# Patient Record
Sex: Female | Born: 1940
Health system: Southern US, Community
[De-identification: ages and names within clinical notes are randomized; demographics above are authoritative.]

## PROBLEM LIST (undated history)

## (undated) DIAGNOSIS — F419 Anxiety disorder, unspecified: Secondary | ICD-10-CM

## (undated) DIAGNOSIS — I10 Essential (primary) hypertension: Secondary | ICD-10-CM

## (undated) DIAGNOSIS — F329 Major depressive disorder, single episode, unspecified: Secondary | ICD-10-CM

## (undated) DIAGNOSIS — M1990 Unspecified osteoarthritis, unspecified site: Secondary | ICD-10-CM

## (undated) DIAGNOSIS — T7840XA Allergy, unspecified, initial encounter: Secondary | ICD-10-CM

## (undated) DIAGNOSIS — F32A Depression, unspecified: Secondary | ICD-10-CM

## (undated) DIAGNOSIS — K589 Irritable bowel syndrome without diarrhea: Secondary | ICD-10-CM

## (undated) HISTORY — DX: Anxiety disorder, unspecified: F41.9

## (undated) HISTORY — PX: HERNIA REPAIR: SHX51

## (undated) HISTORY — DX: Allergy, unspecified, initial encounter: T78.40XA

## (undated) HISTORY — DX: Irritable bowel syndrome, unspecified: K58.9

## (undated) HISTORY — DX: Depression, unspecified: F32.A

## (undated) HISTORY — DX: Essential (primary) hypertension: I10

## (undated) HISTORY — DX: Unspecified osteoarthritis, unspecified site: M19.90

## (undated) HISTORY — PX: TUBAL LIGATION: SHX77

## (undated) HISTORY — PX: EYE SURGERY: SHX253

## (undated) HISTORY — DX: Major depressive disorder, single episode, unspecified: F32.9

---
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
## 1976-07-02 HISTORY — PX: OTHER SURGICAL HISTORY: SHX169

## 1998-09-07 ENCOUNTER — Other Ambulatory Visit: Admission: RE | Admit: 1998-09-07 | Discharge: 1998-09-07 | Payer: Self-pay | Admitting: Gynecology

## 1999-11-20 ENCOUNTER — Other Ambulatory Visit: Admission: RE | Admit: 1999-11-20 | Discharge: 1999-11-20 | Payer: Self-pay | Admitting: Gynecology

## 2000-12-17 ENCOUNTER — Other Ambulatory Visit: Admission: RE | Admit: 2000-12-17 | Discharge: 2000-12-17 | Payer: Self-pay | Admitting: Gynecology

## 2002-01-15 ENCOUNTER — Other Ambulatory Visit: Admission: RE | Admit: 2002-01-15 | Discharge: 2002-01-15 | Payer: Self-pay | Admitting: Gynecology

## 2003-02-08 ENCOUNTER — Other Ambulatory Visit: Admission: RE | Admit: 2003-02-08 | Discharge: 2003-02-08 | Payer: Self-pay | Admitting: Gynecology

## 2004-02-14 ENCOUNTER — Other Ambulatory Visit: Admission: RE | Admit: 2004-02-14 | Discharge: 2004-02-14 | Payer: Self-pay | Admitting: Gynecology

## 2005-02-16 ENCOUNTER — Other Ambulatory Visit: Admission: RE | Admit: 2005-02-16 | Discharge: 2005-02-16 | Payer: Self-pay | Admitting: Gynecology

## 2010-06-14 DIAGNOSIS — E0520 Thyrotoxicosis with toxic multinodular goiter without thyrotoxic crisis or storm: Secondary | ICD-10-CM | POA: Insufficient documentation

## 2010-06-14 DIAGNOSIS — M8580 Other specified disorders of bone density and structure, unspecified site: Secondary | ICD-10-CM | POA: Insufficient documentation

## 2010-06-14 DIAGNOSIS — E559 Vitamin D deficiency, unspecified: Secondary | ICD-10-CM | POA: Insufficient documentation

## 2014-02-18 DIAGNOSIS — F5101 Primary insomnia: Secondary | ICD-10-CM | POA: Insufficient documentation

## 2014-04-01 DIAGNOSIS — J8410 Pulmonary fibrosis, unspecified: Secondary | ICD-10-CM | POA: Insufficient documentation

## 2017-12-17 ENCOUNTER — Encounter

## 2017-12-17 ENCOUNTER — Ambulatory Visit (INDEPENDENT_AMBULATORY_CARE_PROVIDER_SITE_OTHER): Payer: Medicare Other | Admitting: Psychiatry

## 2017-12-17 ENCOUNTER — Encounter (HOSPITAL_COMMUNITY): Payer: Self-pay | Admitting: Psychiatry

## 2017-12-17 VITALS — BP 140/75 | HR 78 | Ht 67.75 in | Wt 161.8 lb

## 2017-12-17 DIAGNOSIS — G4709 Other insomnia: Secondary | ICD-10-CM

## 2017-12-17 DIAGNOSIS — F332 Major depressive disorder, recurrent severe without psychotic features: Secondary | ICD-10-CM

## 2017-12-17 MED ORDER — RAMELTEON 8 MG PO TABS: 8.0000 mg | ORAL_TABLET | Freq: Every day | ORAL | 0 refills | Status: DC

## 2017-12-17 NOTE — Progress Notes (Signed)
Psychiatric Initial Adult Assessment   Patient Identification: Michele Flores MRN:  [id number] Date of Evaluation:  12/17/2017 Referral Source: Dr. Justin Mend, PCP Chief Complaint:  anxiety Visit Diagnosis:    ICD-10-CM   1. Severe episode of recurrent major depressive disorder, without psychotic features ([place]) F33.2   2. Other insomnia G47.09 ramelteon (ROZEREM) 8 MG tablet    History of Present Illness:  Michele Flores is a 77 year old female with a long-standing history of severe recurrent major depressive disorder, insomnia, and multiple psychosocial and external stressors.  She has been tried on a myriad of medications including Prozac, Lexapro, Celexa, vortioxetine, Lamictal, Wellbutrin, amitriptyline.  She has failed to have an adequate response with multiple adequate trials of these medications.  She continues to present with depression symptoms that she feels are getting in the way of her enjoying her retirement and spending time with her husband.  She does have a history of trauma and significant relationship strains in her prior marriages, and recognizes that she has a pattern of controlling behaviors and tends to be anxious about things that she cannot control.  She has a son who suffers with severe persistent mental illness, and she is very involved in his care and coordination of his needs.  She lives in [place] and has a good relationship with her husband of 14 years.  She has a strained relationship with her daughter, but tries to do what she can to keep the peace.  She denies any suicidal thoughts or self-injurious behaviors and has no history of psychiatric hospitalization.  She is on clonazepam prescribed by primary care provider.  I expressed my concern about long-standing clonazepam and/or any other benzodiazepines because of the increased risk of falls, dementia, and cognitive dulling, in addition to the habit forming nature of these medications.  She was with very receptive to  trying ramelteon nightly for sleep, and I educated her on this mechanism of action acting on MT1 receptors.  With regard to mood and depressive symptoms, I suggested we consider TMS given her multiple treatment failures.  She does not have any medical contraindications to [place], no metallic fragments, implanted devices, aneurysm clips, or other metallic objects.  I reviewed the risks and benefits of TMS including the risk of seizure in rare circumstances.  Discussed the 36 treatments generally required for adequate response, and she was receptive to receiving prior authorization information with the [facility] team.  Associated Signs/Symptoms: Depression Symptoms:  depressed mood, insomnia, difficulty concentrating, anxiety, (Hypo) Manic Symptoms:  Irritable Mood, Anxiety Symptoms:  Excessive Worry, Psychotic Symptoms:  none PTSD Symptoms: Negative  Past Psychiatric History: No hospitalizations, history of outpatient psychiatric treatment with multiple psychiatric PAs  Previous Psychotropic Medications: Yes   Substance Abuse History in the last 12 months:  No.  Consequences of Substance Abuse: Negative  Past Medical History:  Past Medical History:  Diagnosis Date  . Anxiety   . Depression   . Hypertension    History reviewed. No pertinent surgical history.  Family Psychiatric History: Family history of schizophrenia, her son  Family History: History reviewed. No pertinent family history.  Social History:   Social History   Socioeconomic History  . Marital status: Married    Spouse name: Not on file  . Number of children: Not on file  . Years of education: Not on file  . Highest education level: Not on file  Occupational History  . Not on file  Social Needs  . Financial resource strain: Not on file  .  Food insecurity:    Worry: Not on file    Inability: Not on file  . Transportation needs:    Medical: Not on file    Non-medical: Not on file  Tobacco Use  . Smoking  status: Never Smoker  . Smokeless tobacco: Never Used  Substance and Sexual Activity  . Alcohol use: Yes    Alcohol/week: 0.6 oz    Types: 1 Glasses of wine per week    Comment: wine  . Drug use: Never  . Sexual activity: Not Currently  Lifestyle  . Physical activity:    Days per week: Not on file    Minutes per session: Not on file  . Stress: Not on file  Relationships  . Social connections:    Talks on phone: Not on file    Gets together: Not on file    Attends religious service: Not on file    Active member of club or organization: Not on file    Attends meetings of clubs or organizations: Not on file    Relationship status: Not on file  Other Topics Concern  . Not on file  Social History Narrative  . Not on file    Additional Social History: Lives with her husband of 15 years, has 2 children from her first husband.  Has 3 prior divorce.  She is a retired Pharmacist, hospital, worked for 35 years  Allergies:  No Known Allergies  Metabolic Disorder Labs: No results found for: HGBA1C, MPG No results found for: PROLACTIN No results found for: CHOL, TRIG, HDL, CHOLHDL, VLDL, LDLCALC   Current Medications: Current Outpatient Medications  Medication Sig Dispense Refill  . BESIVANCE 0.6 % SUSP INSTILL 1 DROP INTO RIGHT EYE 3 TIMES A DAY AS DIRECTED  1  . cholecalciferol (VITAMIN D) 1000 units tablet Take 2,000 Units by mouth daily.    . clonazePAM (KLONOPIN) 1 MG tablet Take 1 mg by mouth 2 (two) times daily as needed.  5  . DUREZOL 0.05 % EMUL APPLY 1 DROP INTO RIGHT EYE THREE TIMES A DAY AS DIRECTED  1  . lisinopril (PRINIVIL,ZESTRIL) 20 MG tablet Take 20 mg by mouth daily.  3  . PROLENSA 0.07 % SOLN APPLY 1 DROP INTO RIGHT EYE AT BEDTIME AS DIRECTED  1  . ramelteon (ROZEREM) 8 MG tablet Take 1 tablet (8 mg total) by mouth at bedtime. 90 tablet 0   No current facility-administered medications for this visit.     Neurologic: Headache: Negative Seizure:  Negative Paresthesias:Negative  Musculoskeletal: Strength & Muscle Tone: within normal limits Gait & Station: normal Patient leans: N/A  Psychiatric Specialty Exam: Review of Systems  Constitutional: Negative.   HENT: Negative.   Respiratory: Negative.   Cardiovascular: Negative.   Gastrointestinal: Negative.   Musculoskeletal: Negative.   Neurological: Negative.   Psychiatric/Behavioral: Positive for depression. The patient has insomnia.     Blood pressure 140/75, pulse 78, height 5' 7.75" (1.721 m), weight 161 lb 12.8 oz (73.4 kg).Body mass index is 24.78 kg/m.  General Appearance: Casual and Well Groomed  Eye Contact:  Good  Speech:  Clear and Coherent and Normal Rate  Volume:  Normal  Mood:  Depressed and Dysphoric  Affect:  Congruent  Thought Process:  Coherent and Descriptions of Associations: Intact  Orientation:  Full (Time, Place, and Person)  Thought Content:  Logical  Suicidal Thoughts:  No  Homicidal Thoughts:  No  Memory:  Immediate;   Good  Judgement:  Good  Insight:  Good  Psychomotor Activity:  Normal  Concentration:  Attention Span: Good  Recall:  Good  Fund of Knowledge:Good  Language: Good  Akathisia:  Negative  Handed:  Right  AIMS (if indicated):  na  Assets:  Communication Skills Desire for Improvement Financial Resources/Insurance Housing Intimacy  ADL's:  Intact  Cognition: WNL  Sleep:  poor    Treatment Plan Summary: Michele Flores is a 77 year old female with a psychiatric history consistent with severe recurrent major depressive disorder.  She is generally a resilient individual and has been able to maintain a fairly high level of function, but reports subjectively severe symptoms and feels that this has negatively affected her ability to enjoy her retirement, and she continues to struggle with ongoing rumination, pessimism, excessive worry, insomnia.  She has no acute safety issues.  She has failed a myriad of psychiatric medications,  over 20 medications and total.  She appears to be an appropriate candidate for [place] and she is agreeable to consider this treatment option.  We agreed to start ramelteon nightly for her sleep symptoms, with the goal of discontinuing the use of clonazepam given the myriad of deleterious effects, and increased risk of falls and neurocognitive disorder.  1. Severe episode of recurrent major depressive disorder, without psychotic features ([place])   2. Other insomnia     Status of current problems: New to [employer] Ordered: No orders of the defined types were placed in this encounter.   Labs Reviewed: NA  Collateral Obtained/Records Reviewed: reviewed [facility]   Plan:  Initiate ramelteon 8 mg nightly [place] prior authorization Provided the patient with literature regarding [place] so she can continue to consider if she would like to proceed with this treatment rtc 6-8 weeks  Aundra Dubin, MD 6/18/201912:02 PM

## 2017-12-17 NOTE — Patient Instructions (Signed)
[employer] [place] [email or web address]

## 2018-01-17 ENCOUNTER — Other Ambulatory Visit (HOSPITAL_COMMUNITY): Payer: Medicare Other | Attending: Psychiatry | Admitting: Emergency Medicine

## 2018-01-17 DIAGNOSIS — F332 Major depressive disorder, recurrent severe without psychotic features: Secondary | ICD-10-CM

## 2018-01-17 NOTE — Progress Notes (Signed)
[facility] for cortical mapping and motor threshold determination for Repetitive Transcranial Magnetic Stimulation treatment for Major Depressive Disorder. Pt completed a PHQ-9 with a score of 24(severe depression). Pt also completed a Beck's Depression Inventory with a score of 33 (severe depression). Prior to procedure, pt signed an informed consent agreement for TMS treatment. Pt's treatment area was found by applying single pulses to her left motor cortex, hunting along the anterior/posterior plane and along the superior oblique angle until the best motor response was elicited from the pt's right thumb. The best response was observed at 6.0 cm A/P and 35 degrees SOA, with a coil angle of 0 degrees. Pt's motor threshold was calculated using the [employer]'s proprietary MT Assist algorithm, which produced a calculated motor threshold of 1.75 SMT. Per these findings, pt's treatment parameters are as follows: A/P -- 11.5 cm, SOA -- 35 degrees, Coil Angle --  +0 degrees, Motor Threshold -- .98 SMT. With these parameters, the pt will receive 36 sessions of TMS according to the following protocol: 3000 pulses per session, with stimulation in bursts of pulses lasting 4 seconds at a frequency of 10 Hz, separated by 26 seconds of rest. After determining pt's tx parameters, coil was moved to the treatment location, and the first burst of pulses was applied at a reduced power of 80%MT. Power level was titrated to 95% today. Power level of 120% will be reached by the end of next week. Pt reported no complaints, and stated that the stimulation was tolerable. Upon completion of mapping, pt completed a few treatment intervals for observation of side effects. Pt tolerated tx well. Pt departed from clinic without issue.

## 2018-01-20 ENCOUNTER — Other Ambulatory Visit (INDEPENDENT_AMBULATORY_CARE_PROVIDER_SITE_OTHER): Payer: Medicare Other | Admitting: Emergency Medicine

## 2018-01-20 DIAGNOSIS — F332 Major depressive disorder, recurrent severe without psychotic features: Secondary | ICD-10-CM | POA: Diagnosis not present

## 2018-01-20 NOTE — Progress Notes (Signed)
[facility] for Repetitive Transcranial Magnetic Stimulation treatment for severe episode of recurrent major depressive disorder, without psychotic features. Patient presented with appropriate affect, level mood and denied any suicidal or homicidal ideations. Patient denies any other current symptoms and remains optimistic with continued TMS treatment. Patient reported no change in alcohol/substane use, caffeine consumption, sleep pattern or metal implant status since previous treatment. Ptpresented withpleasantaffect to txsession.She shared that she had a rough weekend with her mentally ill son. She expressed that her relationship with her daughter and son are her main stressors.She watched videos on her phone during tx session. Power levelwas at titrated to 120%for the duration of the tx.Patient reported nocomplaints anddiscomfort during tx.Patientdeparted post-treatment with no majorreported concerns.

## 2018-01-21 ENCOUNTER — Other Ambulatory Visit (INDEPENDENT_AMBULATORY_CARE_PROVIDER_SITE_OTHER): Payer: Medicare Other | Admitting: Emergency Medicine

## 2018-01-21 DIAGNOSIS — F332 Major depressive disorder, recurrent severe without psychotic features: Secondary | ICD-10-CM | POA: Diagnosis not present

## 2018-01-21 NOTE — Progress Notes (Signed)
[facility] for Repetitive Transcranial Magnetic Stimulation treatment for severe episode of recurrent major depressive disorder, without psychotic features. Patient presented with appropriate affect, level mood and denied any suicidal or homicidal ideations. Patient denies any other current symptoms and remains optimistic with continued TMS treatment. Patient reported no change in alcohol/substane use, caffeine consumption, sleep pattern or metal implant status since previous treatment. Ptpresented withpleasantaffect to txsession.She shared that she experienced a headache at the end of the afternoon. She took an Advil this morning before reporting to tx. She watched TV during tx session. Power level remains at120%for the duration of the tx.Patient reported nocomplaints anddiscomfort during tx.Patientdeparted post-treatment with no majorreported concerns.

## 2018-01-22 ENCOUNTER — Other Ambulatory Visit (INDEPENDENT_AMBULATORY_CARE_PROVIDER_SITE_OTHER): Payer: Medicare Other | Admitting: Emergency Medicine

## 2018-01-22 DIAGNOSIS — F332 Major depressive disorder, recurrent severe without psychotic features: Secondary | ICD-10-CM | POA: Diagnosis not present

## 2018-01-22 NOTE — Progress Notes (Signed)
Patient reported to [facility] for Repetitive Transcranial Magnetic Stimulation treatment for severe episode of recurrent major depressive disorder, without psychotic features. Patient presented with appropriate affect, level mood and denied any suicidal or homicidal ideations. Patient denies any other current symptoms and remains optimistic with continued TMS treatment. Patient reported no change in alcohol/substane use, caffeine consumption, sleep pattern or metal implant status since previous treatment. Ptpresented withpleasantaffect to txsession. She watched TV during tx session.Powerlevel remains at120%for the duration of the tx.Patient reported nocomplaints anddiscomfort during tx.Patientdeparted post-treatment with no majorreported concerns.  

## 2018-01-23 ENCOUNTER — Other Ambulatory Visit (INDEPENDENT_AMBULATORY_CARE_PROVIDER_SITE_OTHER): Payer: Medicare Other | Admitting: Emergency Medicine

## 2018-01-23 DIAGNOSIS — F332 Major depressive disorder, recurrent severe without psychotic features: Secondary | ICD-10-CM | POA: Diagnosis not present

## 2018-01-23 NOTE — Progress Notes (Signed)
[facility] for Repetitive Transcranial Magnetic Stimulation treatment for severe episode of recurrent major depressive disorder, without psychotic features. Patient presented with appropriate affect, level mood and denied any suicidal or homicidal ideations. Patient denies any other current symptoms and remains optimistic with continued TMS treatment. Patient reported no change in alcohol/substane use, caffeine consumption, sleep pattern or metal implant status since previous treatment. Ptpresented withpleasantaffect to txsession. However, she feels anxious this morning - there is no cause. She watched TV during tx session. Power level remains at120%for the duration of the tx.Patient reported nocomplaints anddiscomfort during tx.Patientdeparted post-treatment with no majorreported concerns.

## 2018-01-24 ENCOUNTER — Other Ambulatory Visit (INDEPENDENT_AMBULATORY_CARE_PROVIDER_SITE_OTHER): Payer: Medicare Other | Admitting: Emergency Medicine

## 2018-01-24 DIAGNOSIS — F332 Major depressive disorder, recurrent severe without psychotic features: Secondary | ICD-10-CM | POA: Diagnosis not present

## 2018-01-24 NOTE — Progress Notes (Signed)
[facility] for Repetitive Transcranial Magnetic Stimulation treatment for severe episode of recurrent major depressive disorder, without psychotic features. Patient presented with appropriate affect, level mood and denied any suicidal or homicidal ideations. Patient denies any other current symptoms and remains optimistic with continued TMS treatment. Patient reported no change in alcohol/substane use, caffeine consumption, sleep pattern or metal implant status since previous treatment. Ptpresented withpleasantaffect to txsession. She watched TV during tx session.Powerlevel remains at120%for the duration of the tx.Patient reported nocomplaints anddiscomfort during tx.Patientdeparted post-treatment with no majorreported concerns.

## 2018-01-27 ENCOUNTER — Other Ambulatory Visit (INDEPENDENT_AMBULATORY_CARE_PROVIDER_SITE_OTHER): Payer: Medicare Other | Admitting: Emergency Medicine

## 2018-01-27 DIAGNOSIS — F332 Major depressive disorder, recurrent severe without psychotic features: Secondary | ICD-10-CM

## 2018-01-27 NOTE — Progress Notes (Signed)
[facility] for Repetitive Transcranial Magnetic Stimulation treatment for severe episode of recurrent major depressive disorder, without psychotic features. Patient presented with appropriate affect, level mood and denied any suicidal or homicidal ideations. Patient denies any other current symptoms and remains optimistic with continued TMS treatment. Patient reported no change in alcohol/substane use, caffeine consumption, sleep pattern or metal implant status since previous treatment. Ptpresented withpleasantaffect to txsession. She reported that she had an eventful weekend with her son. Her son has been the main stressor in her life. His mental illness has been affecting her.She watched TV during tx session.Powerlevel remains at120%for the duration of the tx.Patient reported nocomplaints anddiscomfort during tx.Patientdeparted post-treatment with no majorreported concerns.

## 2018-01-28 ENCOUNTER — Other Ambulatory Visit (INDEPENDENT_AMBULATORY_CARE_PROVIDER_SITE_OTHER): Payer: Medicare Other | Admitting: Emergency Medicine

## 2018-01-28 DIAGNOSIS — F332 Major depressive disorder, recurrent severe without psychotic features: Secondary | ICD-10-CM | POA: Diagnosis not present

## 2018-01-28 NOTE — Progress Notes (Signed)
Patient reported to [facility] for Repetitive Transcranial Magnetic Stimulation treatment for severe episode of recurrent major depressive disorder, without psychotic features. Patient presented with appropriate affect, level mood and denied any suicidal or homicidal ideations. Patient denies any other current symptoms and remains optimistic with continued TMS treatment. Patient reported no change in alcohol/substane use, caffeine consumption, sleep pattern or metal implant status since previous treatment. Ptpresented withpleasantaffect to txsession. She watched TV during tx session.Powerlevel remains at120%for the duration of the tx.Patient reported nocomplaints anddiscomfort during tx.Patientdeparted post-treatment with no majorreported concerns.  

## 2018-01-29 ENCOUNTER — Other Ambulatory Visit (INDEPENDENT_AMBULATORY_CARE_PROVIDER_SITE_OTHER): Payer: Medicare Other | Admitting: Emergency Medicine

## 2018-01-29 DIAGNOSIS — F332 Major depressive disorder, recurrent severe without psychotic features: Secondary | ICD-10-CM

## 2018-01-29 NOTE — Progress Notes (Signed)
[facility] for Repetitive Transcranial Magnetic Stimulation treatment for severe episode of recurrent major depressive disorder, without psychotic features. Patient presented with appropriate affect, level mood and denied any suicidal or homicidal ideations. Patient denies any other current symptoms and remains optimistic with continued TMS treatment. Patient reported no change in alcohol/substane use, caffeine consumption, sleep pattern or metal implant status since previous treatment. Ptpresented withpleasantaffect to txsession with her husband, Michele MaduroRobert. She watched TV during tx session and periodically talked to Clinical research associatewriter and husband..Powerlevel remains at120%for the duration of the tx.Patient reported nocomplaints anddiscomfort during tx.Patientand husband departed post-treatment with no majorreported concerns.

## 2018-01-30 ENCOUNTER — Other Ambulatory Visit (HOSPITAL_COMMUNITY): Payer: Medicare Other | Attending: Psychiatry | Admitting: Emergency Medicine

## 2018-01-30 ENCOUNTER — Ambulatory Visit (HOSPITAL_COMMUNITY): Payer: Self-pay | Admitting: Psychiatry

## 2018-01-30 DIAGNOSIS — F332 Major depressive disorder, recurrent severe without psychotic features: Secondary | ICD-10-CM | POA: Diagnosis not present

## 2018-01-30 NOTE — Progress Notes (Signed)
Patient reported to [facility] for Repetitive Transcranial Magnetic Stimulation treatment for severe episode of recurrent major depressive disorder, without psychotic features. Patient presented with appropriate affect, level mood and denied any suicidal or homicidal ideations. Patient denies any other current symptoms and remains optimistic with continued TMS treatment. Patient reported no change in alcohol/substane use, caffeine consumption, sleep pattern or metal implant status since previous treatment. Ptpresented withpleasantaffect to txsession. She watched TV during tx session.Powerlevel remains at120%for the duration of the tx.Patient reported nocomplaints anddiscomfort during tx.Patientdeparted post-treatment with no majorreported concerns.  

## 2018-01-31 ENCOUNTER — Encounter (HOSPITAL_COMMUNITY): Payer: Self-pay | Admitting: Psychiatry

## 2018-01-31 ENCOUNTER — Encounter (HOSPITAL_COMMUNITY): Payer: Medicare Other

## 2018-01-31 ENCOUNTER — Ambulatory Visit (HOSPITAL_COMMUNITY): Payer: Medicare Other | Admitting: Psychiatry

## 2018-01-31 VITALS — BP 136/81 | HR 98 | Ht 67.75 in | Wt 162.0 lb

## 2018-01-31 DIAGNOSIS — F332 Major depressive disorder, recurrent severe without psychotic features: Secondary | ICD-10-CM

## 2018-01-31 NOTE — Progress Notes (Signed)
BH MD/PA/NP OP Progress Note  01/31/2018 8:55 AM Michele BillsBonnie Flores  MRN:  [id number]  Chief Complaint: tms follow-up  HPI: Michele BillsBonnie Flores has yet to feel any symptom improvement yet with TMS. Currently in week 2. Discussed tolerabiltiy which has improved. Agreed to continue course and follow up in 8 weeks. No acute si, hi, avh. Sleep remains an intermittent issue but otherwise no acute needs/complaints at this time.   Visit Diagnosis:    ICD-10-CM   1. Major depressive disorder, recurrent, severe without psychotic features ([facility]) F33.2     Past Psychiatric History: See intake H&P for full details. Reviewed, with no updates at this time.   Past Medical History:  Past Medical History:  Diagnosis Date  . Anxiety   . Depression   . Hypertension    No past surgical history on file.  Family Psychiatric History: See intake H&P for full details. Reviewed, with no updates at this time.   Family History: No family history on file.  Social History:  Social History   Socioeconomic History  . Marital status: Married    Spouse name: Not on file  . Number of children: Not on file  . Years of education: Not on file  . Highest education level: Not on file  Occupational History  . Not on file  Social Needs  . Financial resource strain: Not on file  . Food insecurity:    Worry: Not on file    Inability: Not on file  . Transportation needs:    Medical: Not on file    Non-medical: Not on file  Tobacco Use  . Smoking status: Never Smoker  . Smokeless tobacco: Never Used  Substance and Sexual Activity  . Alcohol use: Yes    Alcohol/week: 0.6 oz    Types: 1 Glasses of wine per week    Comment: wine  . Drug use: Never  . Sexual activity: Not Currently  Lifestyle  . Physical activity:    Days per week: Not on file    Minutes per session: Not on file  . Stress: Not on file  Relationships  . Social connections:    Talks on phone: Not on file    Gets together: Not on file    Attends  religious service: Not on file    Active member of club or organization: Not on file    Attends meetings of clubs or organizations: Not on file    Relationship status: Not on file  Other Topics Concern  . Not on file  Social History Narrative  . Not on file    Allergies: No Known Allergies  Metabolic Disorder Labs: No results found for: HGBA1C, MPG No results found for: PROLACTIN No results found for: CHOL, TRIG, HDL, CHOLHDL, VLDL, LDLCALC No results found for: TSH  Therapeutic Level Labs: No results found for: LITHIUM No results found for: VALPROATE No components found for:  CBMZ  Current Medications: Current Outpatient Medications  Medication Sig Dispense Refill  . clonazePAM (KLONOPIN) 1 MG tablet Take 1 mg by mouth 2 (two) times daily as needed.  5  . lisinopril (PRINIVIL,ZESTRIL) 20 MG tablet Take 20 mg by mouth daily.  3  . BESIVANCE 0.6 % SUSP INSTILL 1 DROP INTO RIGHT EYE 3 TIMES A DAY AS DIRECTED  1  . cholecalciferol (VITAMIN D) 1000 units tablet Take 2,000 Units by mouth daily.    . DUREZOL 0.05 % EMUL APPLY 1 DROP INTO RIGHT EYE THREE TIMES A DAY AS DIRECTED  1  . PROLENSA 0.07 % SOLN APPLY 1 DROP INTO RIGHT EYE AT BEDTIME AS DIRECTED  1  . ramelteon (ROZEREM) 8 MG tablet Take 1 tablet (8 mg total) by mouth at bedtime. (Patient not taking: Reported on 01/31/2018) 90 tablet 0   No current facility-administered medications for this visit.      Musculoskeletal: Strength & Muscle Tone: within normal limits Gait & Station: normal Patient leans: N/A  Psychiatric Specialty Exam: ROS  Blood pressure 136/81, pulse 98, height 5' 7.75" (1.721 m), weight 162 lb (73.5 kg), SpO2 98 %.Body mass index is 24.81 kg/m.  General Appearance: Casual and Well Groomed  Eye Contact:  Good  Speech:  Clear and Coherent and Normal Rate  Volume:  Normal  Mood:  Dysphoric  Affect:  Congruent  Thought Process:  Goal Directed and Descriptions of Associations: Intact  Orientation:   Full (Time, Place, and Person)  Thought Content: Logical   Suicidal Thoughts:  No  Homicidal Thoughts:  No  Memory:  Immediate;   Fair  Judgement:  Fair  Insight:  Good  Psychomotor Activity:  Normal  Concentration:  Concentration: Good  Recall:  Good  Fund of Knowledge: Good  Language: Good  Akathisia:  Negative  Handed:  Right  AIMS (if indicated): not done  Assets:  Communication Skills Desire for Improvement Financial Resources/Insurance Housing  ADL's:  Intact  Cognition: WNL  Sleep:  Good   Screenings:   Assessment and Plan: Michele Flores presents for TMS assessment. Current in midsst of treatment. Will continue course, given good tolerability. No acute safety concerns. Will follow-up in 8 weeks.  1. Major depressive disorder, recurrent, severe without psychotic features (HCC)     Status of current problems: unchanged  Labs Ordered: No orders of the defined types were placed in this encounter.   Labs Reviewed: n/a  Collateral Obtained/Records Reviewed: n/a  Plan:  continue TMS treatment course rtc 8 weeks  Burnard Leigh, MD 01/31/2018, 8:55 AM

## 2018-02-03 ENCOUNTER — Other Ambulatory Visit (HOSPITAL_COMMUNITY): Payer: Medicare Other | Admitting: Emergency Medicine

## 2018-02-03 DIAGNOSIS — F332 Major depressive disorder, recurrent severe without psychotic features: Secondary | ICD-10-CM

## 2018-02-03 NOTE — Progress Notes (Signed)
Patient reported to [facility] for Repetitive Transcranial Magnetic Stimulation treatment for severe episode of recurrent major depressive disorder, without psychotic features. Patient presented with appropriate affect, level mood and denied any suicidal or homicidal ideations. Patient denies any other current symptoms and remains optimistic with continued TMS treatment. Patient reported no change in alcohol/substane use, caffeine consumption, sleep pattern or metal implant status since previous treatment. Ptpresented withpleasantaffect to txsession. She watched TV during tx session.Powerlevel remains at120%for the duration of the tx.Patient reported nocomplaints anddiscomfort during tx.Patientdeparted post-treatment with no majorreported concerns.  

## 2018-02-04 ENCOUNTER — Other Ambulatory Visit (INDEPENDENT_AMBULATORY_CARE_PROVIDER_SITE_OTHER): Payer: Medicare Other | Admitting: Emergency Medicine

## 2018-02-04 DIAGNOSIS — F332 Major depressive disorder, recurrent severe without psychotic features: Secondary | ICD-10-CM

## 2018-02-04 NOTE — Progress Notes (Signed)
[facility] for Repetitive Transcranial Magnetic Stimulation treatment for severe episode of recurrent major depressive disorder, without psychotic features. Patient presented with appropriate affect, level mood and denied any suicidal or homicidal ideations. Patient denies any other current symptoms and remains optimistic with continued TMS treatment. Patient reported no change in alcohol/substane use, caffeine consumption, sleep pattern or metal implant status since previous treatment. Ptpresented withpleasantaffect to txsession.She watched TV during tx session.Powerlevel remains at120%for the duration of the tx.Patient reported nocomplaints anddiscomfort during tx.Patientdeparted post-treatment with no majorreported concerns.

## 2018-02-05 ENCOUNTER — Other Ambulatory Visit (INDEPENDENT_AMBULATORY_CARE_PROVIDER_SITE_OTHER): Payer: Medicare Other | Admitting: Emergency Medicine

## 2018-02-05 DIAGNOSIS — F332 Major depressive disorder, recurrent severe without psychotic features: Secondary | ICD-10-CM | POA: Diagnosis not present

## 2018-02-05 NOTE — Progress Notes (Signed)
Patient reported to [facility] for Repetitive Transcranial Magnetic Stimulation treatment for severe episode of recurrent major depressive disorder, without psychotic features. Patient presented with appropriate affect, level mood and denied any suicidal or homicidal ideations. Patient denies any other current symptoms and remains optimistic with continued TMS treatment. Patient reported no change in alcohol/substane use, caffeine consumption, sleep pattern or metal implant status since previous treatment. Ptpresented withpleasantaffect to txsession. She watched TV during tx session.Powerlevel remains at120%for the duration of the tx.Patient reported nocomplaints anddiscomfort during tx.Patientdeparted post-treatment with no majorreported concerns.  

## 2018-02-06 ENCOUNTER — Other Ambulatory Visit (INDEPENDENT_AMBULATORY_CARE_PROVIDER_SITE_OTHER): Payer: Medicare Other | Admitting: Emergency Medicine

## 2018-02-06 DIAGNOSIS — F332 Major depressive disorder, recurrent severe without psychotic features: Secondary | ICD-10-CM | POA: Diagnosis not present

## 2018-02-06 NOTE — Progress Notes (Signed)
[facility] for Repetitive Transcranial Magnetic Stimulation treatment for severe episode of recurrent major depressive disorder, without psychotic features. Patient presented with appropriate affect, level mood and denied any suicidal or homicidal ideations. Patient denies any other current symptoms and remains optimistic with continued TMS treatment. Patient reported no change in alcohol/substane use, caffeine consumption, sleep pattern or metal implant status since previous treatment. Ptreported to tx session with complaints of her lower back. She is unaware of the cause of the pain. She took Advil this morning and is wearing a patch on her lower back to alleviate the pain/discomfort. She watched TV during tx session.Powerlevel remains at120%for the duration of the tx.Patient reported nocomplaints anddiscomfort during tx.Patientdeparted post-treatment with no majorreported concerns.

## 2018-02-07 ENCOUNTER — Other Ambulatory Visit (INDEPENDENT_AMBULATORY_CARE_PROVIDER_SITE_OTHER): Payer: Medicare Other | Admitting: Emergency Medicine

## 2018-02-07 DIAGNOSIS — F332 Major depressive disorder, recurrent severe without psychotic features: Secondary | ICD-10-CM | POA: Diagnosis not present

## 2018-02-07 NOTE — Progress Notes (Signed)
[facility] for Repetitive Transcranial Magnetic Stimulation treatment for severe episode of recurrent major depressive disorder, without psychotic features. Patient presented with appropriate affect, level mood and denied any suicidal or homicidal ideations. Patient denies any other current symptoms and remains optimistic with continued TMS treatment. Patient reported no change in alcohol/substane use, caffeine consumption, sleep pattern or metal implant status since previous treatment. Ptreported to tx session with complaints of still having pain in her lower back. She hopes for the discomfort to alleviate in the next few days. She watched TV during tx session.Powerlevel remains at120%for the duration of the tx.Patient reported nocomplaints anddiscomfort during tx.Patientdeparted post-treatment with no majorreported concerns.

## 2018-02-10 ENCOUNTER — Other Ambulatory Visit (INDEPENDENT_AMBULATORY_CARE_PROVIDER_SITE_OTHER): Payer: Medicare Other | Admitting: Emergency Medicine

## 2018-02-10 DIAGNOSIS — F332 Major depressive disorder, recurrent severe without psychotic features: Secondary | ICD-10-CM

## 2018-02-10 NOTE — Progress Notes (Signed)
[facility] Repetitive Transcranial Magnetic Stimulation treatment for severe episode of recurrent major depressive disorder, without psychotic features. Patient presented with appropriate affect, level mood and denied any suicidal or homicidal ideations. Patient denies any other current symptoms and remains optimistic with continued TMS treatment. Patient reported no change in alcohol/substane use, caffeine consumption, sleep pattern or metal implant status since previous treatment. Ptreported to tx session with pleasant affect. She reported that her lower back has been feeling better. She watched TV during tx session. And periodically spoke with Clinical research associatewriter.Powerlevel remains at120%for the duration of the tx.Patient reported nocomplaints anddiscomfort during tx.Patientdeparted post-treatment with no majorreported concerns.

## 2018-02-11 ENCOUNTER — Other Ambulatory Visit (INDEPENDENT_AMBULATORY_CARE_PROVIDER_SITE_OTHER): Payer: Medicare Other | Admitting: Emergency Medicine

## 2018-02-11 ENCOUNTER — Encounter (HOSPITAL_COMMUNITY): Payer: Medicare Other

## 2018-02-11 DIAGNOSIS — F332 Major depressive disorder, recurrent severe without psychotic features: Secondary | ICD-10-CM | POA: Diagnosis not present

## 2018-02-11 NOTE — Progress Notes (Signed)
Patient reported to [facility] for Repetitive Transcranial Magnetic Stimulation treatment for severe episode of recurrent major depressive disorder, without psychotic features. Patient presented with appropriate affect, level mood and denied any suicidal or homicidal ideations. Patient denies any other current symptoms and remains optimistic with continued TMS treatment. Patient reported no change in alcohol/substane use, caffeine consumption, sleep pattern or metal implant status since previous treatment. Ptreported to tx session with pleasant affect. She reported that her lower back has been feeling better. She watched TV during tx session. And periodically spoke with writer.Powerlevel remains at120%for the duration of the tx.Patient reported nocomplaints anddiscomfort during tx.Patientdeparted post-treatment with no majorreported concerns. 

## 2018-02-12 ENCOUNTER — Other Ambulatory Visit (INDEPENDENT_AMBULATORY_CARE_PROVIDER_SITE_OTHER): Payer: Medicare Other | Admitting: Emergency Medicine

## 2018-02-12 DIAGNOSIS — F332 Major depressive disorder, recurrent severe without psychotic features: Secondary | ICD-10-CM

## 2018-02-12 NOTE — Progress Notes (Signed)
[facility] for Repetitive Transcranial Magnetic Stimulation treatment for severe episode of recurrent major depressive disorder, without psychotic features. Patient presented with appropriate affect, level mood and denied any suicidal or homicidal ideations. Patient denies any other current symptoms and remains optimistic with continued TMS treatment. Patient reported no change in alcohol/substane use, caffeine consumption, sleep pattern or metal implant status since previous treatment. Ptreported to tx session with pleasant affect. She shared that she woke up early this morning to do Yoga - it has been helping with her depression. She watched TV during tx session. Powerlevel remains at120%for the duration of the tx.Patient reported nocomplaints anddiscomfort during tx.Patientdeparted post-treatment with no majorreported concerns.

## 2018-02-13 ENCOUNTER — Other Ambulatory Visit (INDEPENDENT_AMBULATORY_CARE_PROVIDER_SITE_OTHER): Payer: Medicare Other | Admitting: Emergency Medicine

## 2018-02-13 DIAGNOSIS — F332 Major depressive disorder, recurrent severe without psychotic features: Secondary | ICD-10-CM

## 2018-02-13 NOTE — Progress Notes (Signed)
[facility] for Repetitive Transcranial Magnetic Stimulation treatment for severe episode of recurrent major depressive disorder, without psychotic features. Patient presented with appropriate affect, level mood and denied any suicidal or homicidal ideations. Patient denies any other current symptoms and remains optimistic with continued TMS treatment. Patient reported no change in alcohol/substane use, caffeine consumption, sleep pattern or metal implant status since previous treatment. Ptreported to tx session withpleasant affect. She shared that she feels discouraged that she has not experienced any improvements to her quality of life. She continues to workout in the morning and do yoga. She watched TV during tx session. And periodically spoke with Clinical research associatewriter. Powerlevel remains at120%for the duration of the tx.Patient reported nocomplaints anddiscomfort during tx.Patientdeparted post-treatment with no majorreported concerns.

## 2018-02-14 ENCOUNTER — Other Ambulatory Visit (INDEPENDENT_AMBULATORY_CARE_PROVIDER_SITE_OTHER): Payer: Medicare Other | Admitting: Emergency Medicine

## 2018-02-14 DIAGNOSIS — F332 Major depressive disorder, recurrent severe without psychotic features: Secondary | ICD-10-CM | POA: Diagnosis not present

## 2018-02-14 NOTE — Progress Notes (Signed)
[facility] for Repetitive Transcranial Magnetic Stimulation treatment for severe episode of recurrent major depressive disorder, without psychotic features. Patient presented with appropriate affect, level mood and denied any suicidal or homicidal ideations. Patient denies any other current symptoms and remains optimistic with continued TMS treatment. Patient reported no change in alcohol/substane use, caffeine consumption, sleep pattern or metal implant status since previous treatment. Ptreported to tx session withpleasant affect. She shared that she celebrated her son's birthday at [employer]. She did not have a good experience.  She reported her improvement that she has been noticing from TMS is fleeting. However, she remains hopeful. She watched TV periodically spoke with Clinical research associatewriter. Powerlevel remains at120%for the duration of the tx.Patient reported nocomplaints anddiscomfort during tx.Patientdeparted post-treatment with no majorreported concerns.

## 2018-02-17 ENCOUNTER — Other Ambulatory Visit (INDEPENDENT_AMBULATORY_CARE_PROVIDER_SITE_OTHER): Payer: Medicare Other | Admitting: Emergency Medicine

## 2018-02-17 DIAGNOSIS — F332 Major depressive disorder, recurrent severe without psychotic features: Secondary | ICD-10-CM

## 2018-02-17 NOTE — Progress Notes (Signed)
[facility] for Repetitive Transcranial Magnetic Stimulation treatment for severe episode of recurrent major depressive disorder, without psychotic features. Patient presented with appropriate affect, level mood and denied any suicidal or homicidal ideations. Patient denies any other current symptoms and remains optimistic with continued TMS treatment. Patient reported no change in alcohol/substane use, caffeine consumption, sleep pattern or metal implant status since previous treatment. Ptreported to tx session withpleasant affect. She watched TV periodically spoke with Clinical research associatewriter. She shared that she has plans to spend time with her son this afternoon.Powerlevel remains at120%for the duration of the tx.Patient reported nocomplaints anddiscomfort during tx.Patientdeparted post-treatment with no majorreported concerns.

## 2018-02-18 ENCOUNTER — Other Ambulatory Visit (INDEPENDENT_AMBULATORY_CARE_PROVIDER_SITE_OTHER): Payer: Medicare Other | Admitting: Emergency Medicine

## 2018-02-18 DIAGNOSIS — F332 Major depressive disorder, recurrent severe without psychotic features: Secondary | ICD-10-CM | POA: Diagnosis not present

## 2018-02-18 NOTE — Progress Notes (Signed)
[facility] for Repetitive Transcranial Magnetic Stimulation treatment for severe episode of recurrent major depressive disorder, without psychotic features. Patient presented with appropriate affect, level mood and denied any suicidal or homicidal ideations. Patient denies any other current symptoms and remains optimistic with continued TMS treatment. Patient reported no change in alcohol/substane use, caffeine consumption, sleep pattern or metal implant status since previous treatment. Ptreported to tx session withpleasant affect. She watched TVperiodically spoke with Clinical research associatewriter.Powerlevel remains at120%for the duration of the tx.Patient reported nocomplaints anddiscomfort during tx.Patientdeparted post-treatment with no majorreported concerns.

## 2018-02-19 ENCOUNTER — Other Ambulatory Visit (INDEPENDENT_AMBULATORY_CARE_PROVIDER_SITE_OTHER): Payer: Medicare Other | Admitting: Emergency Medicine

## 2018-02-19 DIAGNOSIS — F332 Major depressive disorder, recurrent severe without psychotic features: Secondary | ICD-10-CM

## 2018-02-19 NOTE — Progress Notes (Signed)
Patient reported to  [facility] for Repetitive Transcranial Magnetic Stimulation treatment for severe episode of recurrent major depressive disorder, without psychotic features. Patient presented with appropriate affect, level mood and denied any suicidal or homicidal ideations. Patient denies any other current symptoms and remains optimistic with continued TMS treatment. Patient reported no change in alcohol/substane use, caffeine consumption, sleep pattern or metal implant status since previous treatment. Ptreported to tx session withpleasant affect. She watched TVperiodically spoke with writer.Powerlevel remains at120%for the duration of the tx.Patient reported nocomplaints anddiscomfort during tx.Patientdeparted post-treatment with no majorreported concerns. 

## 2018-02-20 ENCOUNTER — Other Ambulatory Visit (INDEPENDENT_AMBULATORY_CARE_PROVIDER_SITE_OTHER): Payer: Medicare Other | Admitting: Emergency Medicine

## 2018-02-20 DIAGNOSIS — F332 Major depressive disorder, recurrent severe without psychotic features: Secondary | ICD-10-CM | POA: Diagnosis not present

## 2018-02-20 NOTE — Progress Notes (Signed)
Patient reported to [facility] for Repetitive Transcranial Magnetic Stimulation treatment for severe episode of recurrent major depressive disorder, without psychotic features. Patient presented with appropriate affect, level mood and denied any suicidal or homicidal ideations. Patient denies any other current symptoms and remains optimistic with continued TMS treatment. Patient reported no change in alcohol/substane use, caffeine consumption, sleep pattern or metal implant status since previous treatment. Ptreported to tx session withpleasant affect. She watched TVperiodically spoke with writer.Powerlevel remains at120%for the duration of the tx.Patient reported nocomplaints anddiscomfort during tx.Patientdeparted post-treatment with no majorreported concerns. 

## 2018-02-21 ENCOUNTER — Other Ambulatory Visit (INDEPENDENT_AMBULATORY_CARE_PROVIDER_SITE_OTHER): Payer: Medicare Other | Admitting: Emergency Medicine

## 2018-02-21 DIAGNOSIS — F332 Major depressive disorder, recurrent severe without psychotic features: Secondary | ICD-10-CM

## 2018-02-21 NOTE — Progress Notes (Signed)
Patient reported to [facility] for Repetitive Transcranial Magnetic Stimulation treatment for severe episode of recurrent major depressive disorder, without psychotic features. Patient presented with appropriate affect, level mood and denied any suicidal or homicidal ideations. Patient denies any other current symptoms and remains optimistic with continued TMS treatment. Patient reported no change in alcohol/substane use, caffeine consumption, sleep pattern or metal implant status since previous treatment. Ptreported to tx session withpleasant affect. She watched TVperiodically spoke with writer.Powerlevel remains at120%for the duration of the tx.Patient reported nocomplaints anddiscomfort during tx.Patientdeparted post-treatment with no majorreported concerns. 

## 2018-02-24 ENCOUNTER — Other Ambulatory Visit (INDEPENDENT_AMBULATORY_CARE_PROVIDER_SITE_OTHER): Payer: Medicare Other | Admitting: Emergency Medicine

## 2018-02-24 DIAGNOSIS — F332 Major depressive disorder, recurrent severe without psychotic features: Secondary | ICD-10-CM

## 2018-02-24 NOTE — Progress Notes (Signed)
[facility] for Repetitive Transcranial Magnetic Stimulation treatment for severe episode of recurrent major depressive disorder, without psychotic features. Patient presented with appropriate affect, level mood and denied any suicidal or homicidal ideations. Patient denies any other current symptoms and remains optimistic with continued TMS treatment. Patient reported no change in alcohol/substane use, caffeine consumption, sleep pattern or metal implant status since previous treatment. Ptreported to tx session withpleasant affect. She had a good weekend and spent majority of her time taking care of her son. She watched TVperiodically spoke with Clinical research associatewriter. Powerlevel remains at120%for the duration of the tx.Patient reported nocomplaints anddiscomfort during tx.Patientdeparted post-treatment with no majorreported concerns.

## 2018-02-25 ENCOUNTER — Other Ambulatory Visit (INDEPENDENT_AMBULATORY_CARE_PROVIDER_SITE_OTHER): Payer: Medicare Other | Admitting: Emergency Medicine

## 2018-02-25 DIAGNOSIS — F332 Major depressive disorder, recurrent severe without psychotic features: Secondary | ICD-10-CM

## 2018-02-25 NOTE — Progress Notes (Signed)
Patient reported to [facility] for Repetitive Transcranial Magnetic Stimulation treatment for severe episode of recurrent major depressive disorder, without psychotic features. Patient presented with appropriate affect, level mood and denied any suicidal or homicidal ideations. Patient denies any other current symptoms and remains optimistic with continued TMS treatment. Patient reported no change in alcohol/substane use, caffeine consumption, sleep pattern or metal implant status since previous treatment. Ptreported to tx session withpleasant affect. She watched TVperiodically spoke with writer.Powerlevel remains at120%for the duration of the tx.Patient reported nocomplaints anddiscomfort during tx.Patientdeparted post-treatment with no majorreported concerns. 

## 2018-02-26 ENCOUNTER — Other Ambulatory Visit (INDEPENDENT_AMBULATORY_CARE_PROVIDER_SITE_OTHER): Payer: Medicare Other | Admitting: Emergency Medicine

## 2018-02-26 DIAGNOSIS — F332 Major depressive disorder, recurrent severe without psychotic features: Secondary | ICD-10-CM | POA: Diagnosis not present

## 2018-02-26 NOTE — Progress Notes (Signed)
Patient reported to [facility] for Repetitive Transcranial Magnetic Stimulation treatment for severe episode of recurrent major depressive disorder, without psychotic features. Patient presented with appropriate affect, level mood and denied any suicidal or homicidal ideations. Patient denies any other current symptoms and remains optimistic with continued TMS treatment. Patient reported no change in alcohol/substane use, caffeine consumption, sleep pattern or metal implant status since previous treatment. Ptreported to tx session withpleasant affect. She watched TVperiodically spoke with writer.Powerlevel remains at120%for the duration of the tx.Patient reported nocomplaints anddiscomfort during tx.Patientdeparted post-treatment with no majorreported concerns. 

## 2018-02-27 ENCOUNTER — Other Ambulatory Visit (INDEPENDENT_AMBULATORY_CARE_PROVIDER_SITE_OTHER): Payer: Medicare Other | Admitting: Emergency Medicine

## 2018-02-27 DIAGNOSIS — F332 Major depressive disorder, recurrent severe without psychotic features: Secondary | ICD-10-CM

## 2018-02-27 NOTE — Progress Notes (Signed)
[facility] for Repetitive Transcranial Magnetic Stimulation treatment for severe episode of recurrent major depressive disorder, without psychotic features. Patient presented with appropriate affect, level mood and denied any suicidal or homicidal ideations. Patient denies any other current symptoms and remains optimistic with continued TMS treatment. Patient reported no change in alcohol/substane use, caffeine consumption, sleep pattern or metal implant status since previous treatment. Ptreported to tx session withpleasant affect.She watched TVperiodically spoke with Clinical research associatewriter. Powerlevel remains at120%for the duration of the tx.Patient reported nocomplaints anddiscomfort during tx.Patientdeparted post-treatment with no majorreported concerns.

## 2018-02-28 ENCOUNTER — Other Ambulatory Visit (INDEPENDENT_AMBULATORY_CARE_PROVIDER_SITE_OTHER): Payer: Medicare Other | Admitting: Emergency Medicine

## 2018-02-28 ENCOUNTER — Encounter (HOSPITAL_COMMUNITY): Payer: Medicare Other | Admitting: Emergency Medicine

## 2018-02-28 DIAGNOSIS — F332 Major depressive disorder, recurrent severe without psychotic features: Secondary | ICD-10-CM

## 2018-02-28 NOTE — Progress Notes (Signed)
Patient reported to [facility] for Repetitive Transcranial Magnetic Stimulation treatment for severe episode of recurrent major depressive disorder, without psychotic features. Patient presented with appropriate affect, level mood and denied any suicidal or homicidal ideations. Patient denies any other current symptoms and remains optimistic with continued TMS treatment. Patient reported no change in alcohol/substane use, caffeine consumption, sleep pattern or metal implant status since previous treatment. Ptreported to tx session withpleasant affect. She watched TVperiodically spoke with writer.Powerlevel remains at120%for the duration of the tx.Patient reported nocomplaints anddiscomfort during tx.Patientdeparted post-treatment with no majorreported concerns. 

## 2018-03-03 ENCOUNTER — Encounter (HOSPITAL_COMMUNITY): Payer: Medicare Other

## 2018-03-04 ENCOUNTER — Other Ambulatory Visit (HOSPITAL_COMMUNITY): Payer: Medicare Other | Attending: Psychiatry

## 2018-03-04 DIAGNOSIS — F332 Major depressive disorder, recurrent severe without psychotic features: Secondary | ICD-10-CM

## 2018-03-04 NOTE — Progress Notes (Signed)
Patient reported to [facility] for Repetitive Transcranial Magnetic Stimulation treatment for severe episode of recurrent major depressive disorder, without psychotic features. Patient presented with appropriate affect, level mood and denied any suicidal or homicidal ideations. Patient denies any other current symptoms and remains optimistic with continued TMS tx. Patient reported no changed in alcohol/substance use, caffeine consumption, sleep pattern or metal implant status since previous tx. Patient arrived for tx by herself. Patient reported that she had a good weekend and relaxed at home. Patient reported no complaints or discomfort during the duration of tx. Patient watched television and engaged in conversation. Patient departed post-treatment with no reported concerns.

## 2018-03-05 ENCOUNTER — Other Ambulatory Visit (INDEPENDENT_AMBULATORY_CARE_PROVIDER_SITE_OTHER): Payer: Medicare Other

## 2018-03-05 DIAGNOSIS — F332 Major depressive disorder, recurrent severe without psychotic features: Secondary | ICD-10-CM | POA: Diagnosis not present

## 2018-03-05 NOTE — Progress Notes (Signed)
Patient reported to [facility] for Repetitive Transcranial Magnetic Stimulation treatment for severe episode of recurrent major depressive disorder, without psychotic features. Patient presented with appropriate affect, level mood and denied any suicidal or homicidal ideations. Patient denies any other current symptoms and remains optimistic with continued TMS tx. Patient reported no changed in alcohol/substance use, caffeine consumption, sleep pattern or metal implant status since previous tx. Patient reported no complaints or discomfort during the duration of tx. Patient watched television. Patient departed post-treatment with no reported concerns. 

## 2018-03-06 ENCOUNTER — Other Ambulatory Visit (INDEPENDENT_AMBULATORY_CARE_PROVIDER_SITE_OTHER): Payer: Medicare Other

## 2018-03-06 DIAGNOSIS — F332 Major depressive disorder, recurrent severe without psychotic features: Secondary | ICD-10-CM

## 2018-03-06 NOTE — Progress Notes (Signed)
Patient reported to [facility] for Repetitive Transcranial Magnetic Stimulation treatment for severe episode of recurrent major depressive disorder, without psychotic features. Patient presented with appropriate affect, level mood and denied any suicidal or homicidal ideations. Patient denies any other current symptoms and remains optimistic with continued TMS tx. Patient reported no changed in alcohol/substance use, caffeine consumption, sleep pattern or metal implant status since previous tx. Patient reported that she was having a good day today. Patient watched television during tx. Patient departed post-treatment with no reported concerns.

## 2018-03-10 ENCOUNTER — Encounter (HOSPITAL_COMMUNITY): Payer: Medicare Other | Admitting: Emergency Medicine

## 2018-03-11 ENCOUNTER — Other Ambulatory Visit (INDEPENDENT_AMBULATORY_CARE_PROVIDER_SITE_OTHER): Payer: Medicare Other | Admitting: Emergency Medicine

## 2018-03-11 DIAGNOSIS — F332 Major depressive disorder, recurrent severe without psychotic features: Secondary | ICD-10-CM | POA: Diagnosis not present

## 2018-03-11 NOTE — Progress Notes (Signed)
Patient reported to [facility] for Repetitive Transcranial Magnetic Stimulation treatment for severe episode of recurrent major depressive disorder, without psychotic features. Patient presented with appropriate affect, level mood and denied any suicidal or homicidal ideations. Patient denies any other current symptoms and remains optimistic with continued TMS treatment. Patient reported no change in alcohol/substane use, caffeine consumption, sleep pattern or metal implant status since previous treatment. Ptreported to tx session withpleasant affect.She watched TVperiodically spoke with Clinical research associate. Powerlevel remains at120%for the duration of the tx.Patient reported nocomplaints anddiscomfort during tx.Patientdeparted post-treatment with no majorreported concerns.

## 2018-03-13 ENCOUNTER — Other Ambulatory Visit (INDEPENDENT_AMBULATORY_CARE_PROVIDER_SITE_OTHER): Payer: Medicare Other | Admitting: Emergency Medicine

## 2018-03-13 DIAGNOSIS — F332 Major depressive disorder, recurrent severe without psychotic features: Secondary | ICD-10-CM | POA: Diagnosis not present

## 2018-03-13 NOTE — Progress Notes (Signed)
Patient reported to [facility] for Repetitive Transcranial Magnetic Stimulation treatment for severe episode of recurrent major depressive disorder, without psychotic features. Patient presented with appropriate affect, level mood and denied any suicidal or homicidal ideations. Patient denies any other current symptoms and remains optimistic with continued TMS treatment. Patient reported no change in alcohol/substane use, caffeine consumption, sleep pattern or metal implant status since previous treatment. Ptreported to tx session withpleasant affect.She watched TVperiodically spoke with Clinical research associatewriter. She reported that she has been frustrated dealing with her son's medical appointments and planning around his schedule. Powerlevel remains at120%for the duration of the tx.Patient reported nocomplaints anddiscomfort during tx.Patientdeparted post-treatment with no majorreported concerns.

## 2018-03-17 ENCOUNTER — Other Ambulatory Visit (INDEPENDENT_AMBULATORY_CARE_PROVIDER_SITE_OTHER): Payer: Medicare Other | Admitting: Emergency Medicine

## 2018-03-17 DIAGNOSIS — F332 Major depressive disorder, recurrent severe without psychotic features: Secondary | ICD-10-CM

## 2018-03-17 NOTE — Progress Notes (Signed)
[facility] for Repetitive Transcranial Magnetic Stimulation treatment for severe episode of recurrent major depressive disorder, without psychotic features. Patient presented with appropriate affect, level mood and denied any suicidal or homicidal ideations. Patient denies any other current symptoms and remains optimistic with continued TMS treatment. Patient reported no change in alcohol/substane use, caffeine consumption, sleep pattern or metal implant status since previous treatment. Ptreported to tx session withpleasant affect.She reported that TMS has improved her quality of life. She completed a PHQ-9 questionnaire and scored a 5. Powerlevel remains at120%for the duration of the tx.Patient reported nocomplaints anddiscomfort during tx.Patientdeparted post-treatment with no majorreported concerns.

## 2018-05-14 ENCOUNTER — Other Ambulatory Visit: Payer: Self-pay | Admitting: Gastroenterology

## 2018-05-14 DIAGNOSIS — R1012 Left upper quadrant pain: Secondary | ICD-10-CM

## 2018-05-14 DIAGNOSIS — R1011 Right upper quadrant pain: Secondary | ICD-10-CM

## 2018-05-21 ENCOUNTER — Ambulatory Visit
Admission: RE | Admit: 2018-05-21 | Discharge: 2018-05-21 | Disposition: A | Discharge status: Home / Self Care | Payer: Medicare Other | Source: Ambulatory Visit | Attending: Gastroenterology | Admitting: Gastroenterology

## 2018-05-21 DIAGNOSIS — R1011 Right upper quadrant pain: Secondary | ICD-10-CM

## 2018-05-21 DIAGNOSIS — R1012 Left upper quadrant pain: Secondary | ICD-10-CM

## 2018-05-22 ENCOUNTER — Other Ambulatory Visit: Payer: Self-pay | Admitting: Gastroenterology

## 2018-05-22 DIAGNOSIS — R935 Abnormal findings on diagnostic imaging of other abdominal regions, including retroperitoneum: Secondary | ICD-10-CM

## 2018-06-07 ENCOUNTER — Ambulatory Visit
Admission: RE | Admit: 2018-06-07 | Discharge: 2018-06-07 | Disposition: A | Discharge status: Home / Self Care | Payer: Medicare Other | Source: Ambulatory Visit | Attending: Gastroenterology | Admitting: Gastroenterology

## 2018-06-07 DIAGNOSIS — R935 Abnormal findings on diagnostic imaging of other abdominal regions, including retroperitoneum: Secondary | ICD-10-CM

## 2018-06-07 MED ORDER — GADOBENATE DIMEGLUMINE 529 MG/ML IV SOLN
15.0000 mL | Freq: Once | INTRAVENOUS | Status: AC | PRN
  Administered 2018-06-07: 15 mL via INTRAVENOUS

## 2019-01-13 ENCOUNTER — Other Ambulatory Visit: Payer: Self-pay | Admitting: Gastroenterology

## 2019-01-13 DIAGNOSIS — K8689 Other specified diseases of pancreas: Secondary | ICD-10-CM

## 2019-05-11 ENCOUNTER — Other Ambulatory Visit: Payer: Self-pay | Admitting: Gastroenterology

## 2019-05-11 DIAGNOSIS — K8689 Other specified diseases of pancreas: Secondary | ICD-10-CM

## 2019-05-19 ENCOUNTER — Ambulatory Visit (INDEPENDENT_AMBULATORY_CARE_PROVIDER_SITE_OTHER): Payer: Medicare Other | Admitting: Adult Health

## 2019-05-19 ENCOUNTER — Other Ambulatory Visit: Payer: Self-pay

## 2019-05-19 ENCOUNTER — Encounter: Payer: Self-pay | Admitting: Adult Health

## 2019-05-19 DIAGNOSIS — F605 Obsessive-compulsive personality disorder: Secondary | ICD-10-CM

## 2019-05-19 DIAGNOSIS — F331 Major depressive disorder, recurrent, moderate: Secondary | ICD-10-CM

## 2019-05-19 DIAGNOSIS — F411 Generalized anxiety disorder: Secondary | ICD-10-CM | POA: Diagnosis not present

## 2019-05-19 DIAGNOSIS — F429 Obsessive-compulsive disorder, unspecified: Secondary | ICD-10-CM

## 2019-05-19 MED ORDER — SERTRALINE HCL 25 MG PO TABS: ORAL_TABLET | ORAL | 2 refills | Status: DC

## 2019-05-19 NOTE — Progress Notes (Signed)
[facility] MD/PA/NP Initial Note  05/19/2019 11:13 AM Michele Flores  MRN:  [id number]  Chief Complaint:   HPI:  Seen by Dr. Rene Kocher previously - May 2020. Treated with TMS 2019. Diagnosed with OCPD and could not help her any more. Recommended therapy for her.   Referred by Sharrie Rothman - [facility] - has had 4 visits. She told her that her problems were so bad, she could no longer help her.   Describes mood today as "not good at all". Pleasant. Communicative. Mood symptoms - reports depression, anxiety, and irritability. Stating "I'm more anxious overall". Increased worry and rumination. Obsessive tendencies _ stating "I like for things to be "the way they should be". Has tried and failed multiple medications. Went over genetic testing.  Difficulties letting things go. Stating "I can't live like this anymore". Dealing with multiple situational stressors. Son is Schizoaffective and is on dialysis. He is non-compliant with treatment and is in poor health. Stating "he will end up on the streets if somebody doesn't help me find a place for him to go". Husband health declining. She and husband were high school "sweethearts" and it took them many years to com back together. Brother had lived on street for 10 years before she knew about it and could get him some help. PCP has her on Xanax 2mg  BID. Stating "I'm not taking that much a day". Took a 1mg  today prior to visit. Tried Zoloft 50mg  once and it made her sick and she stopped it. Stable interest and motivation. Taking medications as prescribed.  Energy levels "not to good". Active, does not have a regular exercise routine. Stating "I used to walk". Retired x 16 years - .   Enjoys some usual interests and activities. Married x 4. Lives with husband. Spending time with family. Daughter lives in and brother in .  Appetite adequate - "eating only because she has to eat". Weight loss 10 to 14. Sleeps better some  nights than others. Wakes up at 2 am and takes a Xanax to get back to sleep. Averages 6 to 8 hours. Sometimes naps during the day for 20 minutes. Focus and concentration stable. Completing tasks. Managing aspects of household.  Denies SI or HI. Denies AH or VH.  Previous medications: Lexapro, welllbutrin, cymbalta, zoloft, elavil, xanax, buspar, clonazepam, diazepam, saphris, risperdal, lamictal, and paxil.  Visit Diagnosis:    ICD-10-CM   1. Generalized anxiety disorder  F41.1 sertraline (ZOLOFT) 25 MG tablet  2. Major depressive disorder, recurrent episode, moderate (HCC)  F33.1 sertraline (ZOLOFT) 25 MG tablet  3. Obsessive-compulsive disorder, unspecified type  F42.9 sertraline (ZOLOFT) 25 MG tablet  4. 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   Past Medical History:  Past Medical History:  Diagnosis Date  . Anxiety   . Depression   . Hypertension    History reviewed. No pertinent surgical history.  Family Psychiatric History: No family psychiatric history noted. Son with Schizophrenia.   Family History: History reviewed. No pertinent family history.  Social History:  Social History   Socioeconomic History  . Marital status: Married    Spouse name: Not on file  . Number of children: Not on file  . Years of education: Not on file  . Highest education level: Not on file  Occupational History  . Not on file  Social Needs  . Financial resource strain: Not on file  . Food insecurity    Worry:  Not on file    Inability: Not on file  . Transportation needs    Medical: Not on file    Non-medical: Not on file  Tobacco Use  . Smoking status: Never Smoker  . Smokeless tobacco: Never Used  Substance and Sexual Activity  . Alcohol use: Yes    Alcohol/week: 1.0 standard drinks    Types: 1 Glasses of wine per week    Comment: wine  . Drug use: Never  . Sexual activity: Not Currently   Lifestyle  . Physical activity    Days per week: Not on file    Minutes per session: Not on file  . Stress: Not on file  Relationships  . Social Musicianconnections    Talks on phone: Not on file    Gets together: Not on file    Attends religious service: Not on file    Active member of club or organization: Not on file    Attends meetings of clubs or organizations: Not on file    Relationship status: Not on file  Other Topics Concern  . Not on file  Social History Narrative  . Not on file    Allergies:  Allergies  Allergen Reactions  . Codeine     Metabolic Disorder Labs: No results found for: HGBA1C, MPG No results found for: PROLACTIN No results found for: CHOL, TRIG, HDL, CHOLHDL, VLDL, LDLCALC No results found for: TSH  Therapeutic Level Labs: No results found for: LITHIUM No results found for: VALPROATE No components found for:  CBMZ  Current Medications: Current Outpatient Medications  Medication Sig Dispense Refill  . alprazolam (XANAX) 2 MG tablet Take 2 mg by mouth 2 (two) times daily.    Marland Kitchen. BESIVANCE 0.6 % SUSP INSTILL 1 DROP INTO RIGHT EYE 3 TIMES A DAY AS DIRECTED  1  . cholecalciferol (VITAMIN D) 1000 units tablet Take 2,000 Units by mouth daily.    . clonazePAM (KLONOPIN) 1 MG tablet Take 1 mg by mouth 2 (two) times daily as needed.  5  . DUREZOL 0.05 % EMUL APPLY 1 DROP INTO RIGHT EYE THREE TIMES A DAY AS DIRECTED  1  . lisinopril (PRINIVIL,ZESTRIL) 20 MG tablet Take 20 mg by mouth daily.  3  . PROLENSA 0.07 % SOLN APPLY 1 DROP INTO RIGHT EYE AT BEDTIME AS DIRECTED  1  . ramelteon (ROZEREM) 8 MG tablet Take 1 tablet (8 mg total) by mouth at bedtime. (Patient not taking: Reported on 01/31/2018) 90 tablet 0  . sertraline (ZOLOFT) 25 MG tablet Take one tablet twice daily. 60 tablet 2   No current facility-administered medications for this visit.     Medication Side Effects: none  Orders placed this visit:  No orders of the defined types were placed in this  encounter.   Psychiatric Specialty Exam:  Review of Systems  Musculoskeletal: Positive for falls. Negative for back pain.  Neurological: Negative for tremors and seizures.  Psychiatric/Behavioral: Positive for depression. The patient is nervous/anxious and has insomnia.     There were no vitals taken for this visit.There is no height or weight on file to calculate BMI.  General Appearance: Neat and Well Groomed  Eye Contact:  Good  Speech:  Clear and Coherent  Volume:  Normal  Mood:  Anxious, Depressed and Irritable  Affect:  Congruent  Thought Process:  Coherent and Descriptions of Associations: Intact  Orientation:  Full (Time, Place, and Person)  Thought Content: Logical   Suicidal Thoughts:  No  Homicidal  Thoughts:  No  Memory:  WNL  Judgement:  Good  Insight:  Good  Psychomotor Activity:  Normal  Concentration:  Concentration: Good  Recall:  Good  Fund of Knowledge: Good  Language: Good  Assets:  Communication Skills Desire for Improvement Financial Resources/Insurance Housing Intimacy Leisure Time Physical Health Resilience Social Support Talents/Skills Transportation Vocational/Educational  ADL's:  Intact  Cognition: WNL  Prognosis:  Good   Screenings: None  Receiving Psychotherapy: Yes   Treatment Plan/Recommendations:   Plan:  1. Add Zoloft 50 daily - 12.5mg  x 7 , 25mg  x 7, 37.5mg  x 7, 50mg  x 7 days - has history of taking 50mg  dose once and feeling sick. Started at 50mg  daily. Is willing to try again - will plan to taper up as tolerated and start at low dose initially.   2. Continue therapy   RTC 4 weeks  Patient advised to contact office with any questions, adverse effects, or acute worsening in signs and symptoms.   [name], NP

## 2019-05-25 ENCOUNTER — Other Ambulatory Visit: Payer: Self-pay

## 2019-05-25 ENCOUNTER — Ambulatory Visit
Admission: RE | Admit: 2019-05-25 | Discharge: 2019-05-25 | Disposition: A | Discharge status: Home / Self Care | Payer: Medicare Other | Source: Ambulatory Visit | Attending: Gastroenterology | Admitting: Gastroenterology

## 2019-05-25 DIAGNOSIS — K8689 Other specified diseases of pancreas: Secondary | ICD-10-CM

## 2019-05-25 MED ORDER — GADOBENATE DIMEGLUMINE 529 MG/ML IV SOLN
15.0000 mL | Freq: Once | INTRAVENOUS | Status: AC | PRN
  Administered 2019-05-25: 15 mL via INTRAVENOUS

## 2019-06-10 ENCOUNTER — Other Ambulatory Visit: Payer: Self-pay | Admitting: Adult Health

## 2019-06-10 DIAGNOSIS — F331 Major depressive disorder, recurrent, moderate: Secondary | ICD-10-CM

## 2019-06-10 DIAGNOSIS — F411 Generalized anxiety disorder: Secondary | ICD-10-CM

## 2019-06-10 DIAGNOSIS — F429 Obsessive-compulsive disorder, unspecified: Secondary | ICD-10-CM

## 2019-06-17 ENCOUNTER — Encounter: Payer: Self-pay | Admitting: Adult Health

## 2019-06-17 ENCOUNTER — Other Ambulatory Visit: Payer: Self-pay

## 2019-06-17 ENCOUNTER — Ambulatory Visit (INDEPENDENT_AMBULATORY_CARE_PROVIDER_SITE_OTHER): Payer: Medicare Other | Admitting: Adult Health

## 2019-06-17 DIAGNOSIS — F605 Obsessive-compulsive personality disorder: Secondary | ICD-10-CM

## 2019-06-17 DIAGNOSIS — F411 Generalized anxiety disorder: Secondary | ICD-10-CM | POA: Diagnosis not present

## 2019-06-17 DIAGNOSIS — F331 Major depressive disorder, recurrent, moderate: Secondary | ICD-10-CM

## 2019-06-17 DIAGNOSIS — F332 Major depressive disorder, recurrent severe without psychotic features: Secondary | ICD-10-CM

## 2019-06-17 DIAGNOSIS — F429 Obsessive-compulsive disorder, unspecified: Secondary | ICD-10-CM | POA: Diagnosis not present

## 2019-06-17 NOTE — Progress Notes (Signed)
Michele Flores [id number] Feb 24, 1941 78 y.o.  Virtual Visit via Telephone Note  I connected with pt on 06/17/19 at  8:00 AM EST by telephone and verified that I am speaking with the correct person using two identifiers.   I discussed the limitations, risks, security and privacy concerns of performing an evaluation and management service by telephone and the availability of in person appointments. I also discussed with the patient that there may be a patient responsible charge related to this service. The patient expressed understanding and agreed to proceed.   I discussed the assessment and treatment plan with the patient. The patient was provided an opportunity to ask questions and all were answered. The patient agreed with the plan and demonstrated an understanding of the instructions.   The patient was advised to call back or seek an in-person evaluation if the symptoms worsen or if the condition fails to improve as anticipated.  I provided 30 minutes of non-face-to-face time during this encounter.  The patient was located at home.  The provider was located at [facility].   Dorothyann Gibbs, NP   Subjective:   Patient ID:  Michele Flores is a 78 y.o. (DOB 11/19/1940) female.  Chief Complaint: No chief complaint on file.   HPI Michele Flores presents for follow-up of   Seen by Dr. Rene Kocher previously - May 2020. Treated with TMS 2019. Diagnosed with OCPD and could not help her any more. Recommended therapy for her.   Referred by Sharrie Rothman - [facility] - had 4 visits.   Describes mood today as "not good at all". Pleasant. Communicative. Mood symptoms - reports depression, anxiety, and irritability. More anxious overall. Son passed away suddenly on 06-17-19. Had been going "down hill" for a while. Multiple medical issues. Had Schizoaffective disorder. Called her 50 times a day. Was almost "impossible" to deal with. Husband has said he can see a "big  difference" in her. Has taken Zoloft for a month - titrated. Side effects - stomach pain in the afternoon. Was already having issues before Zoloft. Alka Seltzer has helped. Having issues with sleep. Constipation. Hands are sweating. Does not feel comfortable driving. Has a MRI scheduled. Had a small cyst on pancreas. Recent MRI showed nothing. Has a better appetite since son passed. Does not feel like she is on the edge as much. Mood is better with son passing away. Does not feel depressed. Feels anxious. Stating "I think I have PTSD". Having to make arrangements has been "overwhelming". Stable interest and motivation. Taking medications as prescribed.  Energy levels stable. Active, does not have a regular exercise routine. 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   Enjoys some usual interests and activities. Married x 4. Lives with husband. Daughter lives in [place] and brother in [place].  Appetite adequate. Weight stabilzer. Sleeping difficulties. Waking up during the night. Taking Xanax and going back to sleep most nights.  Focus and concentration stable. Completing tasks. Managing aspects of household. Retired. Denies SI or HI. Denies AH or VH.  Did best with TMS - "while it lasted".  Multiple medication trials.   Previous medications: Lexapro, Welllbutrin, Cymbalta, Zoloft, Elavil, Xanax, Buspar, Clonazepam, Diazepam, Saphris, Risperdal, Lamictal, and Paxil.  Review of Systems:  Review of Systems  Musculoskeletal: Negative for gait problem.  Neurological: Negative for tremors.  Psychiatric/Behavioral:       Please refer to HPI    Medications: I have reviewed the patient's current medications.  Current Outpatient Medications  Medication Sig Dispense Refill  .  alprazolam (XANAX) 2 MG tablet Take 2 mg by mouth 2 (two) times daily.    Marland Kitchen BESIVANCE 0.6 % SUSP INSTILL 1 DROP INTO RIGHT EYE 3 TIMES A DAY AS DIRECTED  1  . cholecalciferol (VITAMIN D) 1000 units tablet Take 2,000 Units by mouth daily.     . clonazePAM (KLONOPIN) 1 MG tablet Take 1 mg by mouth 2 (two) times daily as needed.  5  . DUREZOL 0.05 % EMUL APPLY 1 DROP INTO RIGHT EYE THREE TIMES A DAY AS DIRECTED  1  . lisinopril (PRINIVIL,ZESTRIL) 20 MG tablet Take 20 mg by mouth daily.  3  . PROLENSA 0.07 % SOLN APPLY 1 DROP INTO RIGHT EYE AT BEDTIME AS DIRECTED  1  . ramelteon (ROZEREM) 8 MG tablet Take 1 tablet (8 mg total) by mouth at bedtime. (Patient not taking: Reported on 01/31/2018) 90 tablet 0  . sertraline (ZOLOFT) 25 MG tablet Take one tablet twice daily. 60 tablet 2   No current facility-administered medications for this visit.    Medication Side Effects: None  Allergies:  Allergies  Allergen Reactions  . Codeine     Past Medical History:  Diagnosis Date  . Anxiety   . Depression   . Hypertension     No family history on file.  Social History   Socioeconomic History  . Marital status: Married    Spouse name: Not on file  . Number of children: Not on file  . Years of education: Not on file  . Highest education level: Not on file  Occupational History  . Not on file  Tobacco Use  . Smoking status: Never Smoker  . Smokeless tobacco: Never Used  Substance and Sexual Activity  . Alcohol use: Yes    Alcohol/week: 1.0 standard drinks    Types: 1 Glasses of wine per week    Comment: wine  . Drug use: Never  . Sexual activity: Not Currently  Other Topics Concern  . Not on file  Social History Narrative  . Not on file   Social Determinants of Health   Financial Resource Strain:   . Difficulty of Paying Living Expenses: Not on file  Food Insecurity:   . Worried About Charity fundraiser in the Last Year: Not on file  . Ran Out of Food in the Last Year: Not on file  Transportation Needs:   . Lack of Transportation (Medical): Not on file  . Lack of Transportation (Non-Medical): Not on file  Physical Activity:   . Days of Exercise per Week: Not on file  . Minutes of Exercise per Session: Not on  file  Stress:   . Feeling of Stress : Not on file  Social Connections:   . Frequency of Communication with Friends and Family: Not on file  . Frequency of Social Gatherings with Friends and Family: Not on file  . Attends Religious Services: Not on file  . Active Member of Clubs or Organizations: Not on file  . Attends Archivist Meetings: Not on file  . Marital Status: Not on file  Intimate Partner Violence:   . Fear of Current or Ex-Partner: Not on file  . Emotionally Abused: Not on file  . Physically Abused: Not on file  . Sexually Abused: Not on file    Past Medical History, Surgical history, Social history, and Family history were reviewed and updated as appropriate.   Please see review of systems for further details on the patient's review from today.  Objective:   Physical Exam:  There were no vitals taken for this visit.  Physical Exam Constitutional:      General: She is not in acute distress.    Appearance: She is well-developed.  Musculoskeletal:        General: No deformity.  Neurological:     Mental Status: She is alert and oriented to person, place, and time.     Coordination: Coordination normal.  Psychiatric:        Attention and Perception: Attention and perception normal. She does not perceive auditory or visual hallucinations.        Mood and Affect: Mood normal. Mood is not anxious or depressed. Affect is not labile, blunt, angry or inappropriate.        Speech: Speech normal.        Behavior: Behavior normal.        Thought Content: Thought content normal. Thought content is not paranoid or delusional. Thought content does not include homicidal or suicidal ideation. Thought content does not include homicidal or suicidal plan.        Cognition and Memory: Cognition and memory normal.        Judgment: Judgment normal.     Comments: Insight intact    Lab Review:  No results found for: NA, K, CL, CO2, GLUCOSE, BUN, CREATININE, CALCIUM, PROT,  ALBUMIN, AST, ALT, ALKPHOS, BILITOT, GFRNONAA, GFRAA  No results found for: WBC, RBC, HGB, HCT, PLT, MCV, MCH, MCHC, RDW, LYMPHSABS, MONOABS, EOSABS, BASOSABS  No results found for: POCLITH, LITHIUM   No results found for: PHENYTOIN, PHENOBARB, VALPROATE, CBMZ   .res Assessment: Plan:   Plan:  1. Take Zoloft  daily for 7 days, then d/c - not tolerating. 2. Continue therapy   RTC 4 weeks  Patient advised to contact office with any questions, adverse effects, or acute worsening in signs and symptoms. There are no diagnoses linked to this encounter.  Please see After Visit Summary for patient specific instructions.  No future appointments.  No orders of the defined types were placed in this encounter.     -------------------------------

## 2019-07-01 ENCOUNTER — Ambulatory Visit (INDEPENDENT_AMBULATORY_CARE_PROVIDER_SITE_OTHER): Payer: Medicare Other | Admitting: Adult Health

## 2019-07-01 ENCOUNTER — Encounter: Payer: Self-pay | Admitting: Adult Health

## 2019-07-01 DIAGNOSIS — G4709 Other insomnia: Secondary | ICD-10-CM

## 2019-07-01 DIAGNOSIS — F411 Generalized anxiety disorder: Secondary | ICD-10-CM | POA: Diagnosis not present

## 2019-07-01 DIAGNOSIS — F429 Obsessive-compulsive disorder, unspecified: Secondary | ICD-10-CM | POA: Diagnosis not present

## 2019-07-01 DIAGNOSIS — F331 Major depressive disorder, recurrent, moderate: Secondary | ICD-10-CM

## 2019-07-01 NOTE — Progress Notes (Signed)
Michele Flores [id number] 07-07-1940 78 y.o.  Virtual Visit via Telephone Note  I connected with pt on 07/01/19 at  8:00 AM EST by telephone and verified that I am speaking with the correct person using two identifiers.   I discussed the limitations, risks, security and privacy concerns of performing an evaluation and management service by telephone and the availability of in person appointments. I also discussed with the patient that there may be a patient responsible charge related to this service. The patient expressed understanding and agreed to proceed.   I discussed the assessment and treatment plan with the patient. The patient was provided an opportunity to ask questions and all were answered. The patient agreed with the plan and demonstrated an understanding of the instructions.   The patient was advised to call back or seek an in-person evaluation if the symptoms worsen or if the condition fails to improve as anticipated.  I provided 30 minutes of non-face-to-face time during this encounter.  The patient was located at home.  The provider was located at [place].   Michele Gell, NP   Subjective:   Patient ID:  Michele Flores is a 78 y.o. (DOB [birth year]) female.  Chief Complaint:  Chief Complaint  Patient presents with  . Depression  . Anxiety  . Insomnia  . Other    OCD    HPI   Referred by [facility] [place] - no longer seeing.  Michele Flores presents for follow-up of GAD, MDD, OCD, and insomnia.  Describes mood today as "about the same". Pleasant. Communicative. Mood symptoms - reports depression, anxiety, and irritability. Stating "the good news is I'm feeling better and eating better". Continues to grieve loss of son earlier this month. Stating "I keep seeing him in that chair dead". She and a neighbor had to clean up his "blood". Apartment manager locked his apartment and she was uable to get in to get his things.  Concerns that his date of death is incorrect on his death certificate. Working with funeral home to get resolved. Still having GI issues - constipation. Cannot tolerate Zoloft - "not even the 12.5mg  daily". Has made an appointment with Gastroenterologist to discuss. Had an MRI recently and it was negative. Does not feel like she tolerates any of the SSRI's. Not sleeping well - taking Xanax at bedtime and sometimes during the night. Has started exercising. Had a fall off stairs recently. Daughter called over Christmas "screaming" because she and her grandson had gotten into a fight. Concerned about "the both of them". Stable interest and motivation. Taking medications as prescribed.  Energy levels stable. Active, has started exercising. Retired Pharmacist, hospital.   Enjoys some usual interests and activities. Married x 4 years. Lives with husband. Daughter lives in [place] and brother in [place].  Appetite adequate. Weight stable. Ongoing sleep issues. Waking up during the night. Taking Xanax to help with sleep. Focus and concentration stable. Completing tasks. Managing aspects of household. Denies SI or HI. Denies AH or VH.  Did best with [place] treatment - did not last for very long.  Multiple medication trials and failures.  Previous medications: Lexapro, Welllbutrin, Cymbalta, Zoloft, Elavil, Xanax, Buspar, Clonazepam, Diazepam, Saphris, Risperdal, Lamictal, and Paxil.  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   Review of Systems:  Review of Systems  Musculoskeletal: Negative for gait problem.  Neurological: Negative for tremors.  Psychiatric/Behavioral:       Please refer to HPI    Medications: I have reviewed  the patient's current medications.  Current Outpatient Medications  Medication Sig Dispense Refill  . alprazolam (XANAX) 2 MG tablet Take 2 mg by mouth 2 (two) times daily.    Marland Kitchen. BESIVANCE 0.6 % SUSP INSTILL 1 DROP INTO RIGHT EYE 3 TIMES A DAY AS DIRECTED  1  . cholecalciferol  (VITAMIN D) 1000 units tablet Take 2,000 Units by mouth daily.    . clonazePAM (KLONOPIN) 1 MG tablet Take 1 mg by mouth 2 (two) times daily as needed.  5  . DUREZOL 0.05 % EMUL APPLY 1 DROP INTO RIGHT EYE THREE TIMES A DAY AS DIRECTED  1  . lisinopril (PRINIVIL,ZESTRIL) 20 MG tablet Take 20 mg by mouth daily.  3  . PROLENSA 0.07 % SOLN APPLY 1 DROP INTO RIGHT EYE AT BEDTIME AS DIRECTED  1  . ramelteon (ROZEREM) 8 MG tablet Take 1 tablet (8 mg total) by mouth at bedtime. (Patient not taking: Reported on 01/31/2018) 90 tablet 0  . sertraline (ZOLOFT) 25 MG tablet Take one tablet twice daily. 60 tablet 2   No current facility-administered medications for this visit.    Medication Side Effects: None  Allergies:  Allergies  Allergen Reactions  . Codeine     Past Medical History:  Diagnosis Date  . Anxiety   . Depression   . Hypertension     No family history on file.  Social History   Socioeconomic History  . Marital status: Married    Spouse name: Not on file  . Number of children: Not on file  . Years of education: Not on file  . Highest education level: Not on file  Occupational History  . Not on file  Tobacco Use  . Smoking status: Never Smoker  . Smokeless tobacco: Never Used  Substance and Sexual Activity  . Alcohol use: Yes    Alcohol/week: 1.0 standard drinks    Types: 1 Glasses of wine per week    Comment: wine  . Drug use: Never  . Sexual activity: Not Currently  Other Topics Concern  . Not on file  Social History Narrative  . Not on file   Social Determinants of Health   Financial Resource Strain:   . Difficulty of Paying Living Expenses: Not on file  Food Insecurity:   . Worried About Programme researcher, broadcasting/film/videounning Out of Food in the Last Year: Not on file  . Ran Out of Food in the Last Year: Not on file  Transportation Needs:   . Lack of Transportation (Medical): Not on file  . Lack of Transportation (Non-Medical): Not on file  Physical Activity:   . Days of Exercise  per Week: Not on file  . Minutes of Exercise per Session: Not on file  Stress:   . Feeling of Stress : Not on file  Social Connections:   . Frequency of Communication with Friends and Family: Not on file  . Frequency of Social Gatherings with Friends and Family: Not on file  . Attends Religious Services: Not on file  . Active Member of Clubs or Organizations: Not on file  . Attends BankerClub or Organization Meetings: Not on file  . Marital Status: Not on file  Intimate Partner Violence:   . Fear of Current or Ex-Partner: Not on file  . Emotionally Abused: Not on file  . Physically Abused: Not on file  . Sexually Abused: Not on file    Past Medical History, Surgical history, Social history, and Family history were reviewed and updated as appropriate.  Please see review of systems for further details on the patient's review from today.   Objective:   Physical Exam:  There were no vitals taken for this visit.  Physical Exam Neurological:     Mental Status: She is alert and oriented to person, place, and time.     Cranial Nerves: No dysarthria.  Psychiatric:        Attention and Perception: Attention and perception normal.        Mood and Affect: Mood is anxious and depressed.        Speech: Speech normal.        Behavior: Behavior is cooperative.        Thought Content: Thought content normal. Thought content is not paranoid or delusional. Thought content does not include homicidal or suicidal ideation. Thought content does not include homicidal or suicidal plan.        Cognition and Memory: Cognition and memory normal.        Judgment: Judgment normal.     Comments: Insight intact     Lab Review:  No results found for: NA, K, CL, CO2, GLUCOSE, BUN, CREATININE, CALCIUM, PROT, ALBUMIN, AST, ALT, ALKPHOS, BILITOT, GFRNONAA, GFRAA  No results found for: WBC, RBC, HGB, HCT, PLT, MCV, MCH, MCHC, RDW, LYMPHSABS, MONOABS, EOSABS, BASOSABS  No results found for: POCLITH, LITHIUM    No results found for: PHENYTOIN, PHENOBARB, VALPROATE, CBMZ   .res Assessment: Plan:    Plan:  1. Will send a copy of genetic testing.  RTC 4 weeks   Camelia was seen today for depression, anxiety, insomnia and other.  Diagnoses and all orders for this visit:  Generalized anxiety disorder  Major depressive disorder, recurrent episode, moderate (HCC)  Obsessive-compulsive disorder, unspecified type  Other insomnia    Please see After Visit Summary for patient specific instructions.  No future appointments.  No orders of the defined types were placed in this encounter.     -------------------------------

## 2019-07-12 ENCOUNTER — Ambulatory Visit: Payer: Medicare Other | Attending: Internal Medicine

## 2019-07-12 DIAGNOSIS — Z23 Encounter for immunization: Secondary | ICD-10-CM | POA: Insufficient documentation

## 2019-07-12 NOTE — Progress Notes (Signed)
   Covid-19 Vaccination Clinic  Name:  Aashna Matson    MRN: [id number] DOB: 1941/03/27  07/12/2019  Ms. Llera was observed post Covid-19 immunization for 15 minutes without incidence. She was provided with Vaccine Information Sheet and instruction to access the V-Safe system.   Ms. Galli was instructed to call 911 with any severe reactions post vaccine: Marland Kitchen Difficulty breathing  . Swelling of your face and throat  . A fast heartbeat  . A bad rash all over your body  . Dizziness and weakness    Immunizations Administered    Name Date Dose VIS Date Route   Pfizer COVID-19 Vaccine 07/12/2019  2:31 PM 0.3 mL 06/12/2019 Intramuscular   Manufacturer: [employer], [employer]   Lot: [id number]   NDC: [address]-6

## 2019-07-30 ENCOUNTER — Ambulatory Visit: Payer: Medicare PPO

## 2019-08-01 ENCOUNTER — Ambulatory Visit: Payer: Medicare PPO | Attending: Internal Medicine

## 2019-08-01 DIAGNOSIS — Z23 Encounter for immunization: Secondary | ICD-10-CM | POA: Insufficient documentation

## 2019-08-01 NOTE — Progress Notes (Signed)
   Covid-19 Vaccination Clinic  Name:  Michele Flores    MRN: [id number] DOB: May 27, 1941  08/01/2019  [name] was observed post Covid-19 immunization for 15 minutes without incidence. She was provided with Vaccine Information Sheet and instruction to access the V-Safe system.   [name] was instructed to call 911 with any severe reactions post vaccine: Marland Kitchen Difficulty breathing  . Swelling of your face and throat  . A fast heartbeat  . A bad rash all over your body  . Dizziness and weakness    Immunizations Administered    Name Date Dose VIS Date Route   Pfizer COVID-19 Vaccine 08/01/2019  8:05 AM 0.3 mL 06/12/2019 Intramuscular   Manufacturer: [employer], [employer]   Lot: [id number]   NDC: [address]-9

## 2019-08-05 IMAGING — MR MR ABDOMEN WO/W CM
17 series · 48 of 48 positions shown · IV contrast (multihance)
Comparison: Ultrasound on 05/21/2018

CLINICAL DATA: Indeterminate liver lesion on recent ultrasound.

EXAM:
MRI ABDOMEN WITHOUT AND WITH CONTRAST
TECHNIQUE: Multiplanar multisequence MR imaging of the abdomen was performed
both before and after the administration of intravenous contrast.
CONTRAST:  15mL MULTIHANCE GADOBENATE DIMEGLUMINE 529 MG/ML IV SOLN

[Series 4: T2 · axial · 5.0mm · 1.41mm/px · 1 of 38 slices shown (1 of 3)]
[im 1/38]
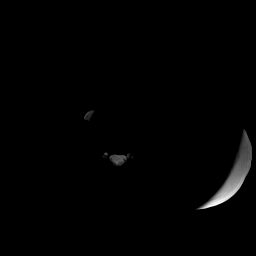

[Series 5: DWI · axial · 5.0mm · 1.34mm/px · z∈[+14,+224]mm · 4 of 108 slices shown (1 of 2)]
[im 1/108]
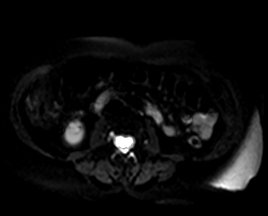
[im 36/108]
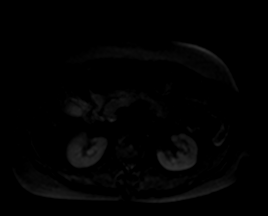
[im 72/108]
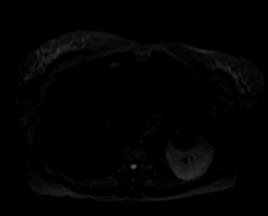
[im 108/108]
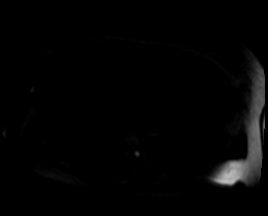

[Series 6: DWI · axial · 5.0mm · 1.34mm/px · z∈[+14,+224]mm · 2 of 36 slices shown (2 of 2)]
[im 1/36]
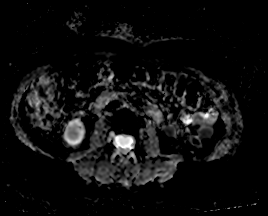
[im 36/36]
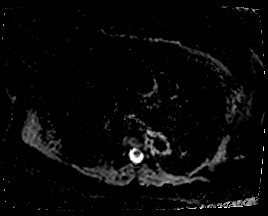

[Series 7: T2 · coronal · 5.0mm · 1.56mm/px · 2 of 36 slices shown (2 of 3)]
[im 1/36]
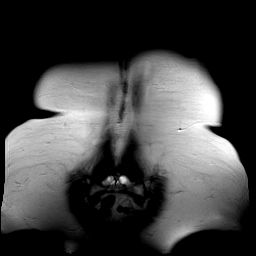
[im 36/36]
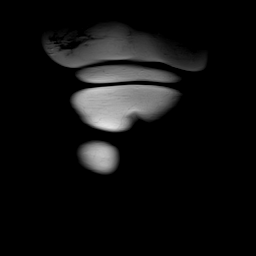

[Series 8: T1 · axial · 3.0mm · 1.12mm/px · z∈[-41,+172]mm · 6 of 144 slices shown]
[im 1/144]
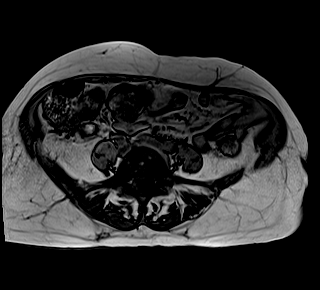
[im 29/144]
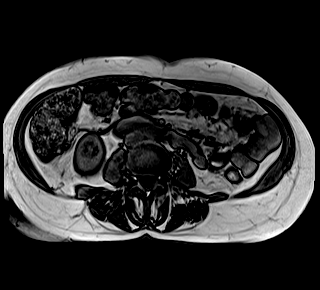
[im 58/144]
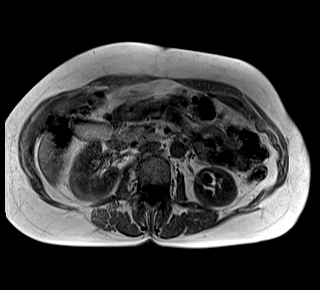
[im 86/144]
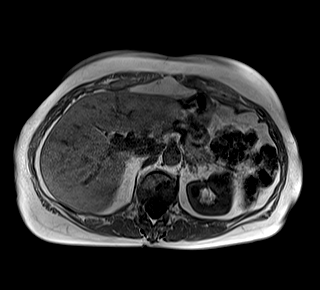
[im 115/144]
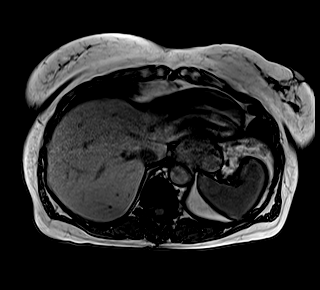
[im 144/144]
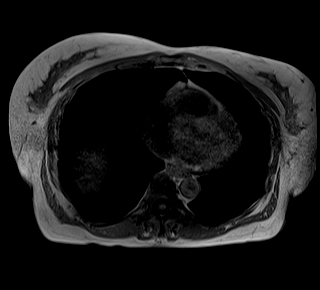

[Series 9: T2 · axial · 6.0mm · 1.12mm/px · 1 of 30 slices shown (3 of 3)]
[im 1/30]
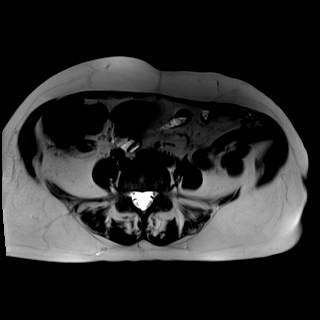

[Series 10: bSSFP · axial · 5.0mm · 1.18mm/px · z∈[-46,+176]mm · 2 of 38 slices shown]
[im 1/38]
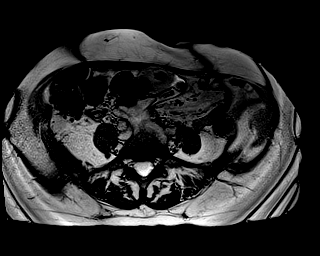
[im 38/38]
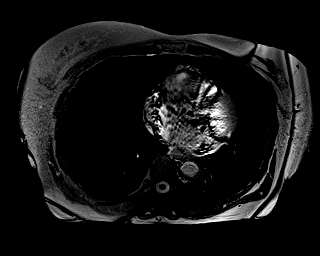

[Series 11: T1 dynamic · axial · non-contrast · 3.0mm · 1.12mm/px · z∈[-27,+186]mm · 3 of 72 slices shown]
[im 1/72]
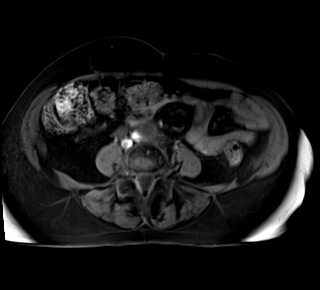
[im 36/72]
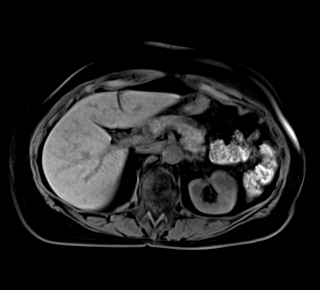
[im 72/72]
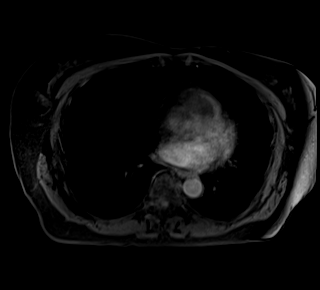

[Series 12: T1 dynamic post-contrast · axial · 3.0mm · 1.12mm/px · z∈[-27,+186]mm · 3 of 72 slices shown (1 of 9)]
[im 1/72]
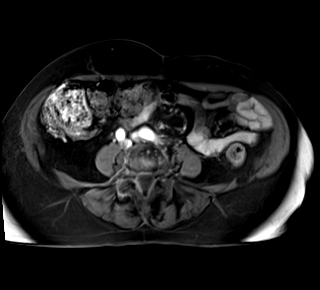
[im 36/72]
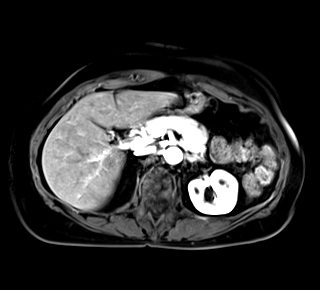
[im 72/72]
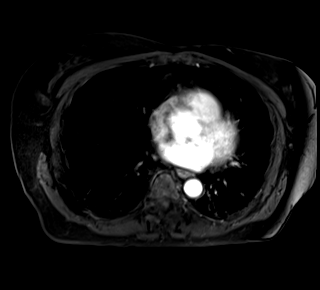

[Series 13: T1 dynamic post-contrast · axial · 3.0mm · 1.12mm/px · z∈[-27,+186]mm · 3 of 72 slices shown (2 of 9)]
[im 1/72]
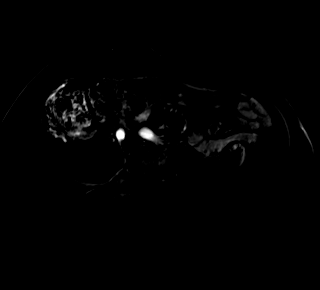
[im 36/72]
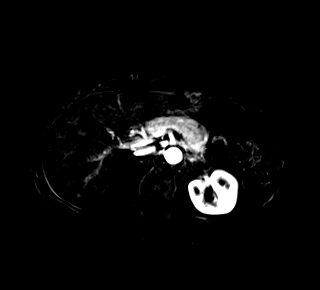
[im 72/72]
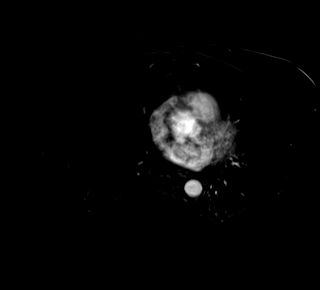

[Series 14: T1 dynamic post-contrast · axial · 3.0mm · 1.12mm/px · z∈[-27,+186]mm · 3 of 72 slices shown (3 of 9)]
[im 1/72]
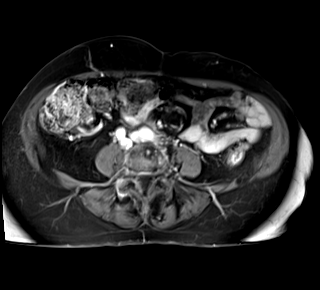
[im 36/72]
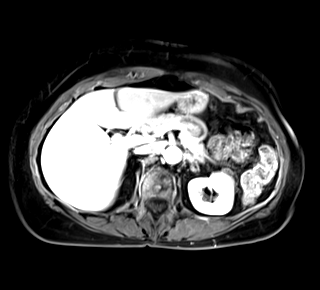
[im 72/72]
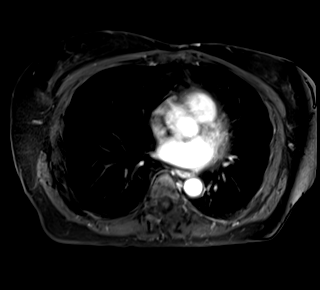

[Series 15: T1 dynamic post-contrast · axial · 3.0mm · 1.12mm/px · z∈[-27,+186]mm · 3 of 72 slices shown (4 of 9)]
[im 1/72]
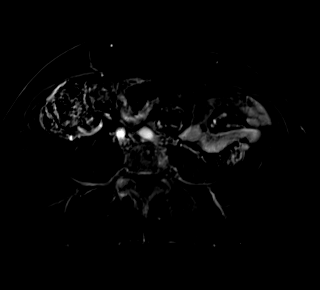
[im 36/72]
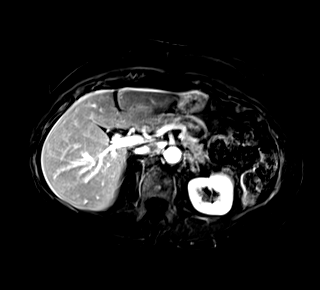
[im 72/72]
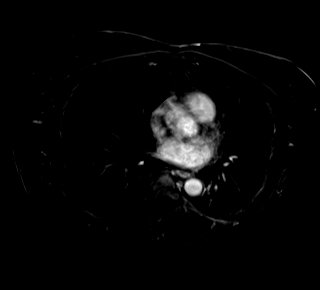

[Series 16: T1 dynamic post-contrast · axial · 3.0mm · 1.12mm/px · z∈[-27,+186]mm · 3 of 72 slices shown (5 of 9)]
[im 1/72]
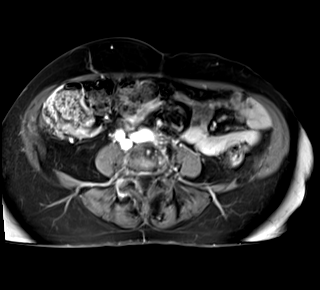
[im 36/72]
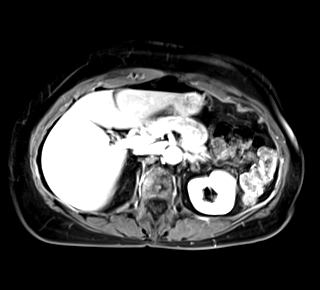
[im 72/72]
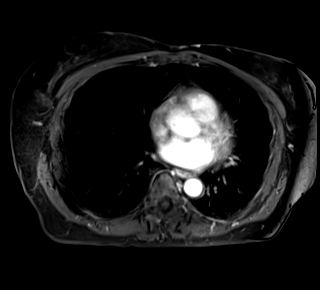

[Series 17: T1 dynamic post-contrast · axial · 3.0mm · 1.12mm/px · z∈[-27,+186]mm · 3 of 72 slices shown (6 of 9)]
[im 1/72]
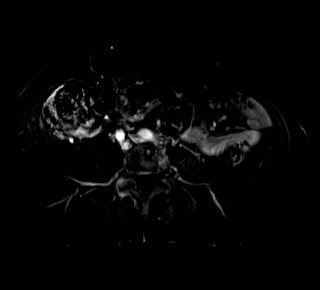
[im 36/72]
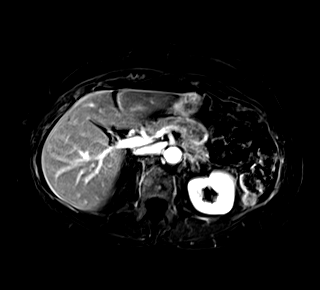
[im 72/72]
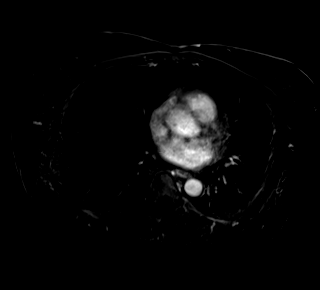

[Series 18: T1 dynamic post-contrast · coronal · 3.0mm · 1.25mm/px · 3 of 64 slices shown (7 of 9)]
[im 1/64]
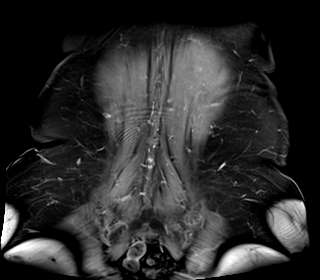
[im 32/64]
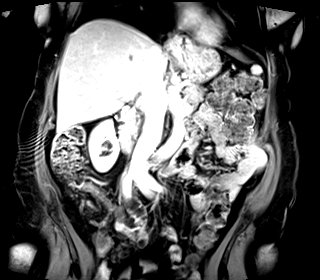
[im 64/64]
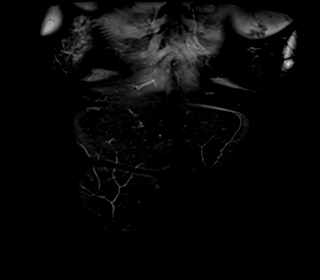

[Series 19: T1 dynamic post-contrast · axial · 3.0mm · 1.12mm/px · z∈[-27,+186]mm · 3 of 72 slices shown (8 of 9)]
[im 1/72]
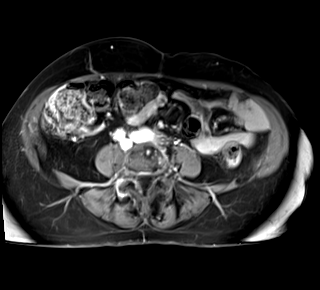
[im 36/72]
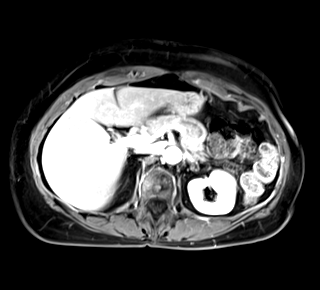
[im 72/72]
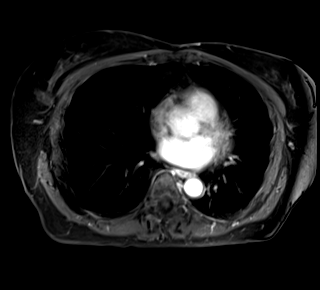

[Series 20: T1 dynamic post-contrast · axial · 3.0mm · 1.12mm/px · z∈[-27,+186]mm · 3 of 72 slices shown (9 of 9)]
[im 1/72]
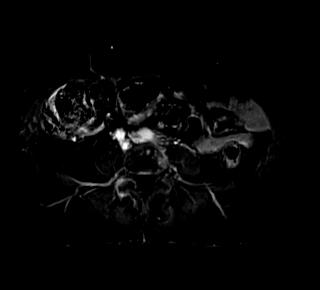
[im 36/72]
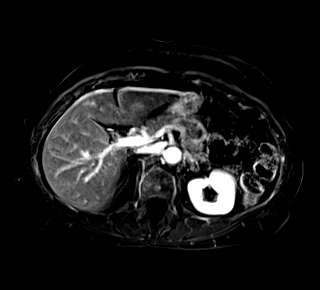
[im 72/72]
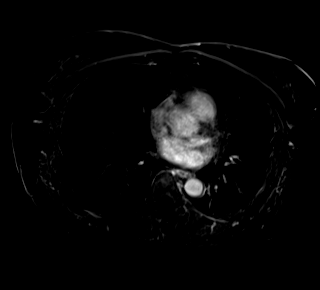

[48 of 48 positions shown; findings below may reference images not displayed]

FINDINGS: Lower chest: No acute findings.

Hepatobiliary: Multiple small benign-appearing cysts are seen in the
right and left lobes. No hepatic masses identified. Gallbladder is
unremarkable. No evidence of biliary duct dilatation.

Pancreas: A small septated cystic lesion is seen in the pancreatic
tail measuring 1.5 cm on image [DATE]. No other pancreatic lesions
identified. No evidence of pancreatic ductal dilatation.

Spleen:  Within normal limits in size and appearance.

Adrenals/Urinary Tract: No masses identified. No evidence of
hydronephrosis.

Stomach/Bowel: Small hiatal hernia.  Otherwise unremarkable.

Vascular/Lymphatic: No pathologically enlarged lymph nodes
identified. No abdominal aortic aneurysm.

Other:  None.

Musculoskeletal:  No suspicious bone lesions identified.
IMPRESSION: Multiple small hepatic cysts.  No evidence of hepatic mass.

1.5 cm complex cystic lesion in the pancreatic tail, suspicious for
indolent cystic pancreatic neoplasm. Recommend continued follow-up
by MRI in 6 months. This recommendation follows ACR consensus
guidelines: Management of Incidental Pancreatic Cysts: A White Paper
of the ACR Incidental Findings Committee. [HOSPITAL]

Small hiatal hernia.

## 2019-11-16 ENCOUNTER — Other Ambulatory Visit: Payer: Self-pay | Admitting: General Surgery

## 2019-11-16 ENCOUNTER — Other Ambulatory Visit (HOSPITAL_COMMUNITY): Payer: Self-pay | Admitting: General Surgery

## 2019-11-16 DIAGNOSIS — G8929 Other chronic pain: Secondary | ICD-10-CM

## 2019-11-25 ENCOUNTER — Encounter (HOSPITAL_COMMUNITY)
Admission: RE | Admit: 2019-11-25 | Discharge: 2019-11-25 | Disposition: A | Discharge status: Home / Self Care | Payer: Medicare PPO | Source: Ambulatory Visit | Attending: General Surgery | Admitting: General Surgery

## 2019-11-25 ENCOUNTER — Other Ambulatory Visit: Payer: Self-pay

## 2019-11-25 DIAGNOSIS — R1012 Left upper quadrant pain: Secondary | ICD-10-CM

## 2019-11-25 DIAGNOSIS — G8929 Other chronic pain: Secondary | ICD-10-CM | POA: Diagnosis not present

## 2019-11-25 DIAGNOSIS — R1011 Right upper quadrant pain: Secondary | ICD-10-CM | POA: Diagnosis not present

## 2019-11-25 MED ORDER — TECHNETIUM TC 99M MEBROFENIN IV KIT
5.0000 | PACK | Freq: Once | INTRAVENOUS | Status: AC | PRN
  Administered 2019-11-25: 5 via INTRAVENOUS

## 2020-01-14 DIAGNOSIS — R1310 Dysphagia, unspecified: Secondary | ICD-10-CM | POA: Diagnosis not present

## 2020-01-15 DIAGNOSIS — R1310 Dysphagia, unspecified: Secondary | ICD-10-CM | POA: Diagnosis not present

## 2020-01-27 ENCOUNTER — Other Ambulatory Visit: Payer: Self-pay

## 2020-01-27 ENCOUNTER — Ambulatory Visit: Payer: Medicare PPO | Admitting: Medical

## 2020-01-27 ENCOUNTER — Encounter: Payer: Self-pay | Admitting: Medical

## 2020-01-27 VITALS — BP 139/78 | HR 66 | Resp 18 | Ht 67.0 in | Wt 157.6 lb

## 2020-01-27 DIAGNOSIS — G4700 Insomnia, unspecified: Secondary | ICD-10-CM

## 2020-01-27 DIAGNOSIS — Z124 Encounter for screening for malignant neoplasm of cervix: Secondary | ICD-10-CM

## 2020-01-27 DIAGNOSIS — K449 Diaphragmatic hernia without obstruction or gangrene: Secondary | ICD-10-CM | POA: Diagnosis not present

## 2020-01-27 DIAGNOSIS — F419 Anxiety disorder, unspecified: Secondary | ICD-10-CM

## 2020-01-27 DIAGNOSIS — Z1283 Encounter for screening for malignant neoplasm of skin: Secondary | ICD-10-CM | POA: Diagnosis not present

## 2020-01-27 DIAGNOSIS — I10 Essential (primary) hypertension: Secondary | ICD-10-CM

## 2020-01-27 DIAGNOSIS — Z79899 Other long term (current) drug therapy: Secondary | ICD-10-CM | POA: Diagnosis not present

## 2020-01-27 DIAGNOSIS — K219 Gastro-esophageal reflux disease without esophagitis: Secondary | ICD-10-CM | POA: Diagnosis not present

## 2020-01-27 NOTE — Patient Instructions (Addendum)
For anxiety/insomnia  continue 1mg  xanax at night. When you are running out of current tablets notify me and will give new rx. Controlled medication rules reviewed. uds today and contract signed.  Htn is good today but not ideal. Continue current medication for bp.   For rt ankle skin concern moisturize twice daily. Can use hydrocortisone twice daily. Avoid friction to area. Refer to derm to review skin and that area.  Follow up 4-6 weeks or as needed.  Sign release of records form today.

## 2020-01-27 NOTE — Progress Notes (Signed)
Subjective:    Patient ID: Michele Flores, female    DOB: [birth year], 79 y.o.   MRN: [id number]  HPI    Pt is new to our office.   Pt was [facility] [place]. Pt does exercise on regular basis. Pt does varied activity. About to start [employer]. Nonsmoker.  Pt has history of insomnia. She states she use xanax for insomnia. She takes 1 mg at night. She has rx that states 2 mg bid. Pt states she does not take medication like that. Written by former pcp that way to reducet trips to pharmacy. Pt mentioned in past not on contract nor did she give uds.   She over the years has anxiety over adult son  who had various medical problems and was on dialysis. He passed away and pt has struggled with anxiety since then and even before passing.  He passed away 07/08/2019.   Pt has high blood pressure hx. BP is better after recheck. No cardiac or neurologic signs or symptoms.  Pt is vaccinated against covid.  Pt has slight raised area rt side lateral malleolus. Present since December 18, 2019. She has concern may need to see dermatologist.    Review of Systems  Constitutional: Negative for chills, fatigue and fever.  Respiratory: Negative for cough, chest tightness, shortness of breath and wheezing.   Cardiovascular: Negative for chest pain and palpitations.  Gastrointestinal: Negative for abdominal pain, diarrhea, nausea and vomiting.  Musculoskeletal: Negative for back pain, myalgias and neck stiffness.  Skin: Negative for rash.       See hpi.  Neurological: Negative for dizziness, speech difficulty, weakness, numbness and headaches.  Hematological: Negative for adenopathy. Does not bruise/bleed easily.  Psychiatric/Behavioral: Positive for sleep disturbance. Negative for behavioral problems. The patient is nervous/anxious.     Past Medical History:  Diagnosis Date  . Anxiety   . Depression   . Hypertension      Social History   Socioeconomic History  . Marital status: Married     Spouse name: Not on file  . Number of children: Not on file  . Years of education: Not on file  . Highest education level: Not on file  Occupational History  . Not on file  Tobacco Use  . Smoking status: Never Smoker  . Smokeless tobacco: Never Used  Vaping Use  . Vaping Use: Never used  Substance and Sexual Activity  . Alcohol use: Yes    Alcohol/week: 1.0 standard drink    Types: 1 Glasses of wine per week    Comment: wine  . Drug use: Never  . Sexual activity: Not Currently  Other Topics Concern  . Not on file  Social History Narrative  . Not on file   Social Determinants of Health   Financial Resource Strain:   . Difficulty of Paying Living Expenses:   Food Insecurity:   . Worried About Programme researcher, broadcasting/film/video in the Last Year:   . Barista in the Last Year:   Transportation Needs:   . Freight forwarder (Medical):   Marland Kitchen Lack of Transportation (Non-Medical):   Physical Activity:   . Days of Exercise per Week:   . Minutes of Exercise per Session:   Stress:   . Feeling of Stress :   Social Connections:   . Frequency of Communication with Friends and Family:   . Frequency of Social Gatherings with Friends and Family:   . Attends Religious Services:   . Active  Member of Clubs or Organizations:   . Attends Banker Meetings:   Marland Kitchen Marital Status:   Intimate Partner Violence:   . Fear of Current or Ex-Partner:   . Emotionally Abused:   Marland Kitchen Physically Abused:   . Sexually Abused:     No past surgical history on file.  No family history on file.  Allergies  Allergen Reactions  . Codeine     Current Outpatient Medications on File Prior to Visit  Medication Sig Dispense Refill  . alprazolam (XANAX) 2 MG tablet Take 2 mg by mouth 2 (two) times daily.    . cholecalciferol (VITAMIN D) 1000 units tablet Take 2,000 Units by mouth daily.    Marland Kitchen lisinopril (PRINIVIL,ZESTRIL) 20 MG tablet Take 20 mg by mouth daily.  3  . BESIVANCE 0.6 % SUSP INSTILL 1  DROP INTO RIGHT EYE 3 TIMES A DAY AS DIRECTED  1  . clonazePAM (KLONOPIN) 1 MG tablet Take 1 mg by mouth 2 (two) times daily as needed.  5  . DUREZOL 0.05 % EMUL APPLY 1 DROP INTO RIGHT EYE THREE TIMES A DAY AS DIRECTED  1  . PROLENSA 0.07 % SOLN APPLY 1 DROP INTO RIGHT EYE AT BEDTIME AS DIRECTED  1  . ramelteon (ROZEREM) 8 MG tablet Take 1 tablet (8 mg total) by mouth at bedtime. (Patient not taking: Reported on 01/31/2018) 90 tablet 0  . sertraline (ZOLOFT) 25 MG tablet Take one tablet twice daily. 60 tablet 2   No current facility-administered medications on file prior to visit.    BP (!) 142/96 (BP Location: Left Arm, Patient Position: Sitting, Cuff Size: Normal)   Pulse 66   Resp 18   Ht 5\' 7"  (1.702 m)   Wt 157 lb 9.6 oz (71.5 kg)   BMI 24.68 kg/m      Objective:   Physical Exam  General Mental Status- Alert. General Appearance- Not in acute distress.   Skin General: Color- Normal Color. Moisture- Normal Moisture.  Neck Carotid Arteries- Normal color. Moisture- Normal Moisture. No carotid bruits. No JVD.  Chest and Lung Exam Auscultation: Breath Sounds:-Normal.  Cardiovascular Auscultation:Rythm- Regular. Murmurs & Other Heart Sounds:Auscultation of the heart reveals- No Murmurs.  Abdomen Inspection:-Inspeection Normal. Palpation/Percussion:Note:No mass. Palpation and Percussion of the abdomen reveal- Non Tender, Non Distended + BS, no rebound or guarding.   Neurologic Cranial Nerve exam:- CN III-XII intact(No nystagmus), symmetric smile. Strength:- 5/5 equal and symmetric strength both upper and lower extremities.      Assessment & Plan:  For anxiety/insomnia  continue 1mg  xanax at night. When you are running out of current tablets notify me and will give new rx. Controlled medication rules reviewed. uds today and contract signed.  Htn is good today but not ideal. Continue current medication for bp.   For rt ankle skin concern moisturize twice daily. Can  use hydrocortisone twice daily. Avoid friction to area. Refer to derm to review skin and that area.  Follow up 4-6 weeks or as needed.  Sign release of records form today.  , PA-C   Time spent with new patient today was 36  minutes which consisted  discussing diagnosis,  Treatment, controlled med rules and documentation.  Sending to supervising DO for review controlled me aspect.

## 2020-01-28 DIAGNOSIS — Z79899 Other long term (current) drug therapy: Secondary | ICD-10-CM | POA: Diagnosis not present

## 2020-01-28 DIAGNOSIS — F419 Anxiety disorder, unspecified: Secondary | ICD-10-CM | POA: Diagnosis not present

## 2020-01-30 LAB — DRUG MONITORING, PANEL 8 WITH CONFIRMATION, URINE
6 Acetylmorphine: NEGATIVE ng/mL (ref <10)
Alcohol Metabolites: POSITIVE ng/mL — AB
Alphahydroxyalprazolam: 285 ng/mL — ABNORMAL HIGH (ref <25)
Alphahydroxymidazolam: NEGATIVE ng/mL (ref <50)
Alphahydroxytriazolam: NEGATIVE ng/mL (ref <50)
Aminoclonazepam: NEGATIVE ng/mL (ref <25)
Amphetamines: NEGATIVE ng/mL (ref <500)
Benzodiazepines: POSITIVE ng/mL — AB (ref <100)
Buprenorphine, Urine: NEGATIVE ng/mL (ref <5)
Cocaine Metabolite: NEGATIVE ng/mL (ref <150)
Creatinine: 130.2 mg/dL
Ethyl Glucuronide (ETG): 18847 ng/mL — ABNORMAL HIGH (ref <500)
Ethyl Sulfate (ETS): 2952 ng/mL — ABNORMAL HIGH (ref <100)
Hydroxyethylflurazepam: NEGATIVE ng/mL (ref <50)
Lorazepam: NEGATIVE ng/mL (ref <50)
MDMA: NEGATIVE ng/mL (ref <500)
Marijuana Metabolite: NEGATIVE ng/mL (ref <20)
Nordiazepam: NEGATIVE ng/mL (ref <50)
Opiates: NEGATIVE ng/mL (ref <100)
Oxazepam: NEGATIVE ng/mL (ref <50)
Oxidant: NEGATIVE ug/mL
Oxycodone: NEGATIVE ng/mL (ref <100)
Temazepam: NEGATIVE ng/mL (ref <50)
pH: 5.8 (ref 4.5–9.0)

## 2020-01-30 LAB — DM TEMPLATE

## 2020-02-02 NOTE — Progress Notes (Signed)
Please place surgery orders. Pt scheduled for PST appt on 02-04-20.

## 2020-02-02 NOTE — Patient Instructions (Addendum)
DUE TO COVID-19 ONLY ONE VISITOR IS ALLOWED TO COME WITH YOU AND STAY IN THE WAITING ROOM ONLY DURING PRE OP AND PROCEDURE DAY OF SURGERY. THE 2 VISITORS MAY VISIT WITH YOU AFTER SURGERY IN YOUR PRIVATE ROOM DURING VISITING HOURS ONLY!  YOU NEED TO HAVE A COVID 19 TEST ON 02-05-20 @_______ , THIS TEST MUST BE DONE BEFORE SURGERY. COVID TESTING SITE [address] [place] , IT IS ON THE RIGHT GOING OUT [address] APPROXITAMELTELY 2 MINUTES PAST [employer] ON THE RIGHT. ONCE YOUR COVID TEST IS COMPLETED,  PLEASE BEGIN THE QUARANTINE INSTRUCTIONS AS OUTLINED IN YOUR HANDOUT.                Lorelle Macaluso  02/02/2020   Your procedure is scheduled on: 02-09-20   Report to [facility] Main  Entrance    Report to Admitting at 12:00 PM     Call this number if you have problems the morning of surgery [phone]    Remember:After Midnight, you may have a Clear Liquid Diet until 8:00 AM.After 8:00 AM, nothing until after surgery.      CLEAR LIQUID DIET   Foods Allowed                                                                     Foods Excluded  Coffee and tea, regular and decaf                             liquids that you cannot  Plain Jell-O any favor except red or purple                                           see through such as: Fruit ices (not with fruit pulp)                                     milk, soups, orange juice  Iced Popsicles                                    All solid food Carbonated beverages, regular and diet                                    Cranberry, grape and apple juices Sports drinks like Gatorade Lightly seasoned clear broth or consume(fat free) Sugar, honey syrup   _____________________________________________________________________    Take these medicines the morning of surgery with A SIP OF WATER: Famotidine (Pepcid), prn, Omeprazole (Prilosec)    BRUSH YOUR TEETH MORNING OF SURGERY AND RINSE YOUR MOUTH  OUT, NO CHEWING GUM CANDY OR MINTS.                                You may not have any metal on your body including hair  pins and              piercings     Do not wear jewelry, make-up, lotions, powders or perfumes, deodorant              Do not wear nail polish on your fingernails.  Do not shave  48 hours prior to surgery.                 Do not bring valuables to the hospital. [place] IS NOT             RESPONSIBLE   FOR VALUABLES.  Contacts, dentures or bridgework may not be worn into surgery.  You may bring a small overnight bag      Special Instructions: N/A              Please read over the following fact sheets you were given: _____________________________________________________________________             [facility] - Preparing for Surgery Before surgery, you can play an important role.  Because skin is not sterile, your skin needs to be as free of germs as possible.  You can reduce the number of germs on your skin by washing with CHG (chlorahexidine gluconate) soap before surgery.  CHG is an antiseptic cleaner which kills germs and bonds with the skin to continue killing germs even after washing. Please DO NOT use if you have an allergy to CHG or antibacterial soaps.  If your skin becomes reddened/irritated stop using the CHG and inform your nurse when you arrive at Short Stay. Do not shave (including legs and underarms) for at least 48 hours prior to the first CHG shower.  You may shave your face/neck. Please follow these instructions carefully:  1.  Shower with CHG Soap the night before surgery and the  morning of Surgery.  2.  If you choose to wash your hair, wash your hair first as usual with your  normal  shampoo.  3.  After you shampoo, rinse your hair and body thoroughly to remove the  shampoo.                           4.  Use CHG as you would any other liquid soap.  You can apply chg directly  to the skin and wash                       Gently with a scrungie  or clean washcloth.  5.  Apply the CHG Soap to your body ONLY FROM THE NECK DOWN.   Do not use on face/ open                           Wound or open sores. Avoid contact with eyes, ears mouth and genitals (private parts).                       Wash face,  Genitals (private parts) with your normal soap.             6.  Wash thoroughly, paying special attention to the area where your surgery  will be performed.  7.  Thoroughly rinse your body with warm water from the neck down.  8.  DO NOT shower/wash with your normal soap after using and rinsing off  the CHG Soap.  9.  Pat yourself dry with a clean towel.            10.  Wear clean pajamas.            11.  Place clean sheets on your bed the night of your first shower and do not  sleep with pets. Day of Surgery : Do not apply any lotions/deodorants the morning of surgery.  Please wear clean clothes to the hospital/surgery center.  FAILURE TO FOLLOW THESE INSTRUCTIONS MAY RESULT IN THE CANCELLATION OF YOUR SURGERY PATIENT SIGNATURE_________________________________  NURSE SIGNATURE__________________________________  ________________________________________________________________________

## 2020-02-02 NOTE — Progress Notes (Signed)
COVID Vaccine Completed: Date COVID Vaccine completed: COVID vaccine manufacturer: Pfizer    Moderna   [employer]   PCP - Esperanza Richters, MD Cardiologist -   Chest x-ray -  EKG -  Stress Test -  ECHO -  Cardiac Cath -   Sleep Study -  CPAP -   Fasting Blood Sugar -  Checks Blood Sugar _____ times a day  Blood Thinner Instructions: Aspirin Instructions: Last Dose:  Anesthesia review:   Patient denies shortness of breath, fever, cough and chest pain at PAT appointment   Patient verbalized understanding of instructions that were given to them at the PAT appointment. Patient was also instructed that they will need to review over the PAT instructions again at home before surgery.

## 2020-02-04 ENCOUNTER — Ambulatory Visit: Payer: Self-pay | Admitting: General Surgery

## 2020-02-04 ENCOUNTER — Other Ambulatory Visit: Payer: Self-pay

## 2020-02-04 ENCOUNTER — Other Ambulatory Visit (HOSPITAL_COMMUNITY): Payer: Medicare PPO

## 2020-02-04 ENCOUNTER — Encounter (HOSPITAL_COMMUNITY)
Admission: RE | Admit: 2020-02-04 | Discharge: 2020-02-04 | Disposition: A | Discharge status: Home / Self Care | Payer: Medicare PPO | Source: Ambulatory Visit | Attending: General Surgery | Admitting: General Surgery

## 2020-02-04 ENCOUNTER — Encounter (HOSPITAL_COMMUNITY): Payer: Self-pay

## 2020-02-04 DIAGNOSIS — I10 Essential (primary) hypertension: Secondary | ICD-10-CM | POA: Insufficient documentation

## 2020-02-04 DIAGNOSIS — Z01818 Encounter for other preprocedural examination: Secondary | ICD-10-CM | POA: Insufficient documentation

## 2020-02-04 LAB — BASIC METABOLIC PANEL
Anion gap: 7 (ref 5–15)
BUN: 17 mg/dL (ref 8–23)
CO2: 30 mmol/L (ref 22–32)
Calcium: 9.5 mg/dL (ref 8.9–10.3)
Chloride: 102 mmol/L (ref 98–111)
Creatinine, Ser: 0.98 mg/dL (ref 0.44–1.00)
GFR calc Af Amer: >60 mL/min (ref >60)
GFR calc non Af Amer: 55 mL/min — ABNORMAL LOW (ref >60)
Glucose, Bld: 105 mg/dL — ABNORMAL HIGH (ref 70–99)
Potassium: 4.4 mmol/L (ref 3.5–5.1)
Sodium: 139 mmol/L (ref 135–145)

## 2020-02-04 LAB — CBC
HCT: 49.5 % — ABNORMAL HIGH (ref 36.0–46.0)
Hemoglobin: 16.3 g/dL — ABNORMAL HIGH (ref 12.0–15.0)
MCH: 32 pg (ref 26.0–34.0)
MCHC: 32.9 g/dL (ref 30.0–36.0)
MCV: 97.1 fL (ref 80.0–100.0)
Platelets: 270 10*3/uL (ref 150–400)
RBC: 5.1 MIL/uL (ref 3.87–5.11)
RDW: 12.5 % (ref 11.5–15.5)
WBC: 7 10*3/uL (ref 4.0–10.5)
nRBC: 0 % (ref 0.0–0.2)

## 2020-02-05 ENCOUNTER — Other Ambulatory Visit (HOSPITAL_COMMUNITY)
Admission: RE | Admit: 2020-02-05 | Discharge: 2020-02-05 | Disposition: A | Discharge status: Home / Self Care | Payer: Medicare PPO | Source: Ambulatory Visit | Attending: General Surgery | Admitting: General Surgery

## 2020-02-05 ENCOUNTER — Ambulatory Visit: Payer: Self-pay | Admitting: General Surgery

## 2020-02-05 DIAGNOSIS — Z01812 Encounter for preprocedural laboratory examination: Secondary | ICD-10-CM | POA: Insufficient documentation

## 2020-02-05 DIAGNOSIS — Z20822 Contact with and (suspected) exposure to covid-19: Secondary | ICD-10-CM | POA: Insufficient documentation

## 2020-02-05 LAB — SARS CORONAVIRUS 2 (TAT 6-24 HRS): SARS Coronavirus 2: NEGATIVE

## 2020-02-05 NOTE — H&P (Signed)
Rona Ravens Appointment: 01/27/2020 8:30 AM Location: [facility] Patient #: [id number] DOB: 11/30/1940 Married / Language: [place] / Race: White Female  History of Present Illness Minerva Areola M. Quintessa Simmerman MD; 02/05/2020 4:44 PM) The patient is a 79 year old female who presents for an evaluation of a hernia. She comes back in for follow-up after initial visit for hiatal hernia. After her visit I have talked with her on the phone at least once for about 30 to 40 minutes regarding the need for additional evaluation for which she underwent at [facility] as well as discussion about the anatomy and potential surgical options. She comes back in today to review the entire work-up. She is accompanied by her husband. She denies any medical changes since she was seen. She states that she is tired of having the upper abdominal pain. She still denies classic symptoms of heartburn per se. But she does have daily upper abdominal pain. She is also interested in getting off the medication.   Patient underwent pH impedance as well as esophageal manometry at [facility]. Her pH study showed an abnormal distal esophageal acid exposure time off medication measured by pH. There is abnormal reflux frequency measured by impedance. The patient did not report any symptoms during the study. Total number reflux episodes on impedance 59 total pH acid exposure percent time 11% (normal less than 4.2%). DeMeester score 44.9. All reflux episodes 55. Manometry showed hiatal hernia of 5 cm, normal supine esophageal motility. There is normal tensive and normal relaxing LES with intact adequate peristalsis. Normal deglutitive inhibition, normal bolus clearance   11/12/19  She is referred by Dr Marca Ancona for evaluation of a hiatal hernia. She is accompanied by her husband. She states that she has been having upper abdominal pain for years. She points to both the left upper and right upper abdomen and  central abdomen. She states has gotten worse over the past year. It will feel like it goes to her back at times. Denies any heartburn. She denies any acid taste in the back of her throat or mouth. She denies any vomiting or regurgitation. She denies any trouble swallowing liquids or solids. She does not wake up in overnight with discomfort or choking or acid taste in the back of her mouth. He denies any weight change. Will generally started having the discomfort in the morning and she will take Pepcid AC 3 times a day and Prilosec intermittently. She states that if she takes the Pepcid it'll cause the discomfort to ease off. She states that her discomfort in her upper abnormal get worse throughout the day. She describes it as a "bad feeling ". She states that she may feel better if she stands up. If she is sitting down or leaning backward it may cause the discomfort to persist. She denies any weight change. She states that she can get full quickly. Rare bloating. No prior abdominal surgery. She states that she does have constipation. She has to take a stool softener or laxative otherwise she will have pellets.  The only thing that will definitely make her upper abdominal discomfort worse is stress and anxiety. She states that she cut out alcohol know that she drink that much of it at the recommendation of the GI office suggestion about 6 weeks ago with no change in her symptoms and she is curious if she can resume social alcohol. She does not take NSAIDs frequently.  I reviewed GI medicine's office note from January 15 &  February 17.. H. pylori was negative. She had a normal A1c. Comprehensive metabolic panel and CBC were normal as well.  She underwent an upper endoscopy on March 22 which I reviewed. Upper third and middle third esophagus were normal. A widely patent/ring, the GE junction. A 4 cm hiatal hernia was present. Localized mild erythematous mucosa without bleeding in the  antrum. Biopsies taken. Cardia and gastric fundus were normal on retroflexion except for hiatal hernia. Duodenum normal. Biopsy showed chronic inactive gastritis with reactive epithelium. Negative for H. pylori.  She has had 2 MRIs one in November 2019 and another one in November 2020. I reviewed that one. I could see the hiatal hernia on that MRI. Larger than a sliding hiatal hernia. Consistent with endoscopic impression of probably about a 4 cm hiatal hernia. Chronic appearing 14 mm cystic lesion in the pancreatic tail which was stable and recommended a tear follow-up. She had a prior ultrasound of her abdomen in November 2019 which showed no gallstones. There are no gallstones on the MRI either.     Problem List/Past Medical Minerva Areola M. Andrey Campanile, MD; 02/05/2020 4:50 PM) CONSTIPATION IN FEMALE (K59.00) PANCREATIC CYST (K86.2) HIATAL HERNIA WITH GERD (K21.9, K44.9) This patient encounter took 30 minutes on day of visit to perform the following: take history, perform exam, review outside records, interpret imaging, counsel the patient on their diagnosis and discuss surgery CHRONIC BILATERAL UPPER ABDOMINAL PAIN (R10.11, R10.12)  Past Surgical History Minerva Areola M. Andrey Campanile, MD; 02/05/2020 4:50 PM) Cataract Surgery Bilateral.  Diagnostic Studies History Minerva Areola M. Andrey Campanile, MD; 02/05/2020 4:50 PM) Colonoscopy 5-10 years ago Mammogram within last year Pap Smear >5 years ago  Allergies Laurette Schimke, RMA; 01/27/2020 8:30 AM) Codeine/Codeine Derivatives Allergies Reconciled  Medication History Laurette Schimke, RMA; 01/27/2020 8:30 AM) ALPRAZolam (2MG  Tablet, Oral) Active. Famotidine (20MG  Tablet, Oral) Active. Lisinopril (20MG  Tablet, Oral) Active. PriLOSEC OTC (20MG  Tablet DR, Oral) Active. Medications Reconciled  Social History M. , MD; 02/05/2020 4:50 PM) Alcohol use Moderate alcohol use. No caffeine use No drug use Tobacco use Former smoker.  Family History  Minerva Areola M. Andrey Campanile, MD; 02/05/2020 4:50 PM) Heart disease in female family member before age 98 Respiratory Condition Father.  Pregnancy / Birth History M M. 04/06/2020, MD; 02/05/2020 4:50 PM) Age at menarche 15 years. Age of menopause >47 Gravida 71 Maternal age 75-25 Para 2  Other Problems 04/06/2020. >76, MD; 02/05/2020 4:50 PM) Back Pain Depression High blood pressure     Review of Systems 23-34 M. Nyal Schachter MD; 02/05/2020 4:45 PM) All other systems negative  Vitals 04/06/2020 [place] RMA; 01/27/2020 8:31 AM) 01/27/2020 8:31 AM Weight: 160.5 lb Height: 67in Body Surface Area: 1.84 m Body Mass Index: 25.14 kg/m  Temp.: 97.25F  Pulse: 59 (Regular)  P.OX: 91% (Room air) BP: 122/74(Sitting, Left Arm, Standard)        Physical Exam 04/06/2020 M. Hansel Devan MD; 02/05/2020 4:45 PM)  General Mental Status-Alert. General Appearance-Consistent with stated age. Posture-Normal posture. Voice-Normal.  Integumentary Note: no rash or lesion on limited exam  Head and Neck Head -Note:atraumatic, normocephalic.  Face Strength and Tone - facial muscle strength and tone is normal.  Eye Eyeball - Bilateral-Extraocular movements intact. Sclera/Conjunctiva - Bilateral-Normal.  Chest and Lung Exam Chest and lung exam reveals -quiet, even and easy respiratory effort with no use of accessory muscles.  Neuropsychiatric Mental status exam performed with findings of-able to articulate well with normal speech/language, rate, volume and coherence. The patient's mood and affect are described as -normal. Judgment  and Insight-insight is appropriate concerning matters relevant to self and the patient displays appropriate judgment regarding every day activities. Thought Processes/Cognitive Function-aware of current events.  Musculoskeletal Note: strength symmetrical throughout, no deformity    Assessment & Plan Minerva Areola M. Saanya Zieske MD; 02/05/2020 4:50 PM)  HIATAL  HERNIA WITH GERD (K21.9) Story: This patient encounter took 30 minutes on day of visit to perform the following: take history, perform exam, review outside records, interpret imaging, counsel the patient on their diagnosis and discuss surgery Impression: We talked quite extensively at her first visit, subsequently on the phone for approximately 30 to 40 minutes and again today's visit for another 30 minutes or so. She is accompanied by her husband. We reviewed her upper endoscopy, her pH probe and manometry results from [facility]. We discussed that she does have a fairly decent sized hiatal hernia. We discussed that she could continue with ongoing medical management. However she is interested in getting off the heartburn medication as well is improving her upper abdominal discomfort. Using a diagram booklet I read discussed the anatomy as well as surgical intervention which would be reduction of the stomach back into the abdomen, mobilization of the esophagus, closure of the hiatus as well as performing a fundoplication. We discussed the rationale for performing a fundoplication. We discussed a partial fundoplication versus a full fundoplication. We discussed pros and cons of each. We discussed the incidence of dysphagia, gas bloat, resolution of reflux/heartburn reported in literature. We discussed the typical recovery. We discussed the diet progression. We discussed that there may be trouble with certain solids. We discussed the possibility of not being able to come off heartburn medication. After much discussion she and I both decided on a partial wrap. We discussed risk and benefits of the procedure including but not limited to bleeding, infection, injury to surrounding structures such as the lung, esophagus, aorta, intestines; perioperative pulmonary and cardiac events, blood clot formation, wound infection, pneumothorax, hiatal hernia recurrence, slipped wrap, and persistent symptoms. All of her  questions were asked and answered. she has decided to proceed with xi robotic hiatal hernia repair with partial fundoplication and poss egd  Current Plans Pt Education - Pamphlet Given - Gastroesophagel Reflux Disease: discussed with patient and provided information. Pt Education - [facility] Free Text Education/Instructions You are being scheduled for surgery- Our schedulers will call you.  You should hear from our office's scheduling department within 5 working days about the location, date, and time of surgery. We try to make accommodations for patient's preferences in scheduling surgery, but sometimes the OR schedule or the surgeon's schedule prevents [place] from making those accommodations.  If you have not heard from our office [phone]) in 5 working days, call the office and ask for your surgeon's nurse.  If you have other questions about your diagnosis, plan, or surgery, call the office and ask for your surgeon's nurse.  [name], MD, [name] General, Bariatric, & Minimally Invasive Surgery [facility], [place]

## 2020-02-05 NOTE — H&P (View-Only) (Signed)
Rona Ravens Appointment: 01/27/2020 8:30 AM Location: [facility] Patient #: [id number] DOB: 11/30/1940 Married / Language: [place] / Race: White Female  History of Present Illness Minerva Areola M. Mazella Deen MD; 02/05/2020 4:44 PM) The patient is a 79 year old female who presents for an evaluation of a hernia. She comes back in for follow-up after initial visit for hiatal hernia. After her visit I have talked with her on the phone at least once for about 30 to 40 minutes regarding the need for additional evaluation for which she underwent at [facility] as well as discussion about the anatomy and potential surgical options. She comes back in today to review the entire work-up. She is accompanied by her husband. She denies any medical changes since she was seen. She states that she is tired of having the upper abdominal pain. She still denies classic symptoms of heartburn per se. But she does have daily upper abdominal pain. She is also interested in getting off the medication.   Patient underwent pH impedance as well as esophageal manometry at [facility]. Her pH study showed an abnormal distal esophageal acid exposure time off medication measured by pH. There is abnormal reflux frequency measured by impedance. The patient did not report any symptoms during the study. Total number reflux episodes on impedance 59 total pH acid exposure percent time 11% (normal less than 4.2%). DeMeester score 44.9. All reflux episodes 55. Manometry showed hiatal hernia of 5 cm, normal supine esophageal motility. There is normal tensive and normal relaxing LES with intact adequate peristalsis. Normal deglutitive inhibition, normal bolus clearance   11/12/19  She is referred by Dr Marca Ancona for evaluation of a hiatal hernia. She is accompanied by her husband. She states that she has been having upper abdominal pain for years. She points to both the left upper and right upper abdomen and  central abdomen. She states has gotten worse over the past year. It will feel like it goes to her back at times. Denies any heartburn. She denies any acid taste in the back of her throat or mouth. She denies any vomiting or regurgitation. She denies any trouble swallowing liquids or solids. She does not wake up in overnight with discomfort or choking or acid taste in the back of her mouth. He denies any weight change. Will generally started having the discomfort in the morning and she will take Pepcid AC 3 times a day and Prilosec intermittently. She states that if she takes the Pepcid it'll cause the discomfort to ease off. She states that her discomfort in her upper abnormal get worse throughout the day. She describes it as a "bad feeling ". She states that she may feel better if she stands up. If she is sitting down or leaning backward it may cause the discomfort to persist. She denies any weight change. She states that she can get full quickly. Rare bloating. No prior abdominal surgery. She states that she does have constipation. She has to take a stool softener or laxative otherwise she will have pellets.  The only thing that will definitely make her upper abdominal discomfort worse is stress and anxiety. She states that she cut out alcohol know that she drink that much of it at the recommendation of the GI office suggestion about 6 weeks ago with no change in her symptoms and she is curious if she can resume social alcohol. She does not take NSAIDs frequently.  I reviewed GI medicine's office note from January 15 &  February 17.. H. pylori was negative. She had a normal A1c. Comprehensive metabolic panel and CBC were normal as well.  She underwent an upper endoscopy on March 22 which I reviewed. Upper third and middle third esophagus were normal. A widely patent/ring, the GE junction. A 4 cm hiatal hernia was present. Localized mild erythematous mucosa without bleeding in the  antrum. Biopsies taken. Cardia and gastric fundus were normal on retroflexion except for hiatal hernia. Duodenum normal. Biopsy showed chronic inactive gastritis with reactive epithelium. Negative for H. pylori.  She has had 2 MRIs one in November 2019 and another one in November 2020. I reviewed that one. I could see the hiatal hernia on that MRI. Larger than a sliding hiatal hernia. Consistent with endoscopic impression of probably about a 4 cm hiatal hernia. Chronic appearing 14 mm cystic lesion in the pancreatic tail which was stable and recommended a tear follow-up. She had a prior ultrasound of her abdomen in November 2019 which showed no gallstones. There are no gallstones on the MRI either.     Problem List/Past Medical Minerva Areola M. Andrey Campanile, MD; 02/05/2020 4:50 PM) CONSTIPATION IN FEMALE (K59.00) PANCREATIC CYST (K86.2) HIATAL HERNIA WITH GERD (K21.9, K44.9) This patient encounter took 30 minutes on day of visit to perform the following: take history, perform exam, review outside records, interpret imaging, counsel the patient on their diagnosis and discuss surgery CHRONIC BILATERAL UPPER ABDOMINAL PAIN (R10.11, R10.12)  Past Surgical History Minerva Areola M. Andrey Campanile, MD; 02/05/2020 4:50 PM) Cataract Surgery Bilateral.  Diagnostic Studies History Minerva Areola M. Andrey Campanile, MD; 02/05/2020 4:50 PM) Colonoscopy 5-10 years ago Mammogram within last year Pap Smear >5 years ago  Allergies Laurette Schimke, RMA; 01/27/2020 8:30 AM) Codeine/Codeine Derivatives Allergies Reconciled  Medication History Laurette Schimke, RMA; 01/27/2020 8:30 AM) ALPRAZolam (2MG  Tablet, Oral) Active. Famotidine (20MG  Tablet, Oral) Active. Lisinopril (20MG  Tablet, Oral) Active. PriLOSEC OTC (20MG  Tablet DR, Oral) Active. Medications Reconciled  Social History M. , MD; 02/05/2020 4:50 PM) Alcohol use Moderate alcohol use. No caffeine use No drug use Tobacco use Former smoker.  Family History  Minerva Areola M. Andrey Campanile, MD; 02/05/2020 4:50 PM) Heart disease in female family member before age 98 Respiratory Condition Father.  Pregnancy / Birth History M M. 04/06/2020, MD; 02/05/2020 4:50 PM) Age at menarche 15 years. Age of menopause >47 Gravida 71 Maternal age 75-25 Para 2  Other Problems 04/06/2020. >76, MD; 02/05/2020 4:50 PM) Back Pain Depression High blood pressure     Review of Systems 23-34 M. Randa Riss MD; 02/05/2020 4:45 PM) All other systems negative  Vitals 04/06/2020 [place] RMA; 01/27/2020 8:31 AM) 01/27/2020 8:31 AM Weight: 160.5 lb Height: 67in Body Surface Area: 1.84 m Body Mass Index: 25.14 kg/m  Temp.: 97.25F  Pulse: 59 (Regular)  P.OX: 91% (Room air) BP: 122/74(Sitting, Left Arm, Standard)        Physical Exam 04/06/2020 M. Marieanne Marxen MD; 02/05/2020 4:45 PM)  General Mental Status-Alert. General Appearance-Consistent with stated age. Posture-Normal posture. Voice-Normal.  Integumentary Note: no rash or lesion on limited exam  Head and Neck Head -Note:atraumatic, normocephalic.  Face Strength and Tone - facial muscle strength and tone is normal.  Eye Eyeball - Bilateral-Extraocular movements intact. Sclera/Conjunctiva - Bilateral-Normal.  Chest and Lung Exam Chest and lung exam reveals -quiet, even and easy respiratory effort with no use of accessory muscles.  Neuropsychiatric Mental status exam performed with findings of-able to articulate well with normal speech/language, rate, volume and coherence. The patient's mood and affect are described as -normal. Judgment  and Insight-insight is appropriate concerning matters relevant to self and the patient displays appropriate judgment regarding every day activities. Thought Processes/Cognitive Function-aware of current events.  Musculoskeletal Note: strength symmetrical throughout, no deformity    Assessment & Plan (Curlee Bogan M. Foye Damron MD; 02/05/2020 4:50 PM)  HIATAL  HERNIA WITH GERD (K21.9) Story: This patient encounter took 30 minutes on day of visit to perform the following: take history, perform exam, review outside records, interpret imaging, counsel the patient on their diagnosis and discuss surgery Impression: We talked quite extensively at her first visit, subsequently on the phone for approximately 30 to 40 minutes and again today's visit for another 30 minutes or so. She is accompanied by her husband. We reviewed her upper endoscopy, her pH probe and manometry results from [facility]. We discussed that she does have a fairly decent sized hiatal hernia. We discussed that she could continue with ongoing medical management. However she is interested in getting off the heartburn medication as well is improving her upper abdominal discomfort. Using a diagram booklet I read discussed the anatomy as well as surgical intervention which would be reduction of the stomach back into the abdomen, mobilization of the esophagus, closure of the hiatus as well as performing a fundoplication. We discussed the rationale for performing a fundoplication. We discussed a partial fundoplication versus a full fundoplication. We discussed pros and cons of each. We discussed the incidence of dysphagia, gas bloat, resolution of reflux/heartburn reported in literature. We discussed the typical recovery. We discussed the diet progression. We discussed that there may be trouble with certain solids. We discussed the possibility of not being able to come off heartburn medication. After much discussion she and I both decided on a partial wrap. We discussed risk and benefits of the procedure including but not limited to bleeding, infection, injury to surrounding structures such as the lung, esophagus, aorta, intestines; perioperative pulmonary and cardiac events, blood clot formation, wound infection, pneumothorax, hiatal hernia recurrence, slipped wrap, and persistent symptoms. All of her  questions were asked and answered. she has decided to proceed with xi robotic hiatal hernia repair with partial fundoplication and poss egd  Current Plans Pt Education - Pamphlet Given - Gastroesophagel Reflux Disease: discussed with patient and provided information. Pt Education - [facility] Free Text Education/Instructions You are being scheduled for surgery- Our schedulers will call you.  You should hear from our office's scheduling department within 5 working days about the location, date, and time of surgery. We try to make accommodations for patient's preferences in scheduling surgery, but sometimes the OR schedule or the surgeon's schedule prevents us from making those accommodations.  If you have not heard from our office ([phone]) in 5 working days, call the office and ask for your surgeon's nurse.  If you have other questions about your diagnosis, plan, or surgery, call the office and ask for your surgeon's nurse.  Lodie Waheed M. Najir Roop, MD, [name] General, Bariatric, & Minimally Invasive Surgery [facility]  

## 2020-02-08 MED ORDER — BUPIVACAINE LIPOSOME 1.3 % IJ SUSP
20.0000 mL | Freq: Once | INTRAMUSCULAR | Status: DC
  Filled 2020-02-08: qty 20

## 2020-02-08 NOTE — Progress Notes (Signed)
Michele Flores called to verify her arrival time, she ws instructed to be here at 12 noon, she verbalized understanding.

## 2020-02-09 ENCOUNTER — Ambulatory Visit (HOSPITAL_COMMUNITY): Payer: Medicare PPO | Admitting: Anesthesiology

## 2020-02-09 ENCOUNTER — Encounter (HOSPITAL_COMMUNITY)
Admission: RE | Disposition: A | Discharge status: Home / Self Care | Payer: Self-pay | Source: Home / Self Care | Attending: General Surgery

## 2020-02-09 ENCOUNTER — Observation Stay (HOSPITAL_COMMUNITY)
Admission: RE | Admit: 2020-02-09 | Discharge: 2020-02-10 | Disposition: A | Discharge status: Home / Self Care | Payer: Medicare PPO | Attending: General Surgery | Admitting: General Surgery

## 2020-02-09 ENCOUNTER — Encounter (HOSPITAL_COMMUNITY): Payer: Self-pay | Admitting: General Surgery

## 2020-02-09 DIAGNOSIS — K449 Diaphragmatic hernia without obstruction or gangrene: Principal | ICD-10-CM | POA: Insufficient documentation

## 2020-02-09 DIAGNOSIS — Z8719 Personal history of other diseases of the digestive system: Secondary | ICD-10-CM

## 2020-02-09 DIAGNOSIS — K862 Cyst of pancreas: Secondary | ICD-10-CM | POA: Insufficient documentation

## 2020-02-09 DIAGNOSIS — Z9889 Other specified postprocedural states: Secondary | ICD-10-CM

## 2020-02-09 DIAGNOSIS — F418 Other specified anxiety disorders: Secondary | ICD-10-CM | POA: Diagnosis not present

## 2020-02-09 DIAGNOSIS — Z87891 Personal history of nicotine dependence: Secondary | ICD-10-CM | POA: Diagnosis not present

## 2020-02-09 DIAGNOSIS — K219 Gastro-esophageal reflux disease without esophagitis: Secondary | ICD-10-CM | POA: Diagnosis not present

## 2020-02-09 DIAGNOSIS — I10 Essential (primary) hypertension: Secondary | ICD-10-CM | POA: Diagnosis not present

## 2020-02-09 HISTORY — PX: XI ROBOTIC ASSISTED HIATAL HERNIA REPAIR: SHX6889

## 2020-02-09 HISTORY — PX: UPPER GI ENDOSCOPY: SHX6162

## 2020-02-09 LAB — CBC WITH DIFFERENTIAL/PLATELET
Abs Immature Granulocytes: 0.04 10*3/uL (ref 0.00–0.07)
Basophils Absolute: 0 10*3/uL (ref 0.0–0.1)
Basophils Relative: 0 %
Eosinophils Absolute: 0 10*3/uL (ref 0.0–0.5)
Eosinophils Relative: 0 %
HCT: 44.2 % (ref 36.0–46.0)
Hemoglobin: 14.8 g/dL (ref 12.0–15.0)
Immature Granulocytes: 0 %
Lymphocytes Relative: 6 %
Lymphs Abs: 0.7 10*3/uL (ref 0.7–4.0)
MCH: 32.3 pg (ref 26.0–34.0)
MCHC: 33.5 g/dL (ref 30.0–36.0)
MCV: 96.5 fL (ref 80.0–100.0)
Monocytes Absolute: 0.3 10*3/uL (ref 0.1–1.0)
Monocytes Relative: 3 %
Neutro Abs: 10.9 10*3/uL — ABNORMAL HIGH (ref 1.7–7.7)
Neutrophils Relative %: 91 %
Platelets: 232 10*3/uL (ref 150–400)
RBC: 4.58 MIL/uL (ref 3.87–5.11)
RDW: 12.5 % (ref 11.5–15.5)
WBC: 12.1 10*3/uL — ABNORMAL HIGH (ref 4.0–10.5)
nRBC: 0 % (ref 0.0–0.2)

## 2020-02-09 LAB — COMPREHENSIVE METABOLIC PANEL
ALT: 37 U/L (ref 0–44)
AST: 45 U/L — ABNORMAL HIGH (ref 15–41)
Albumin: 3.5 g/dL (ref 3.5–5.0)
Alkaline Phosphatase: 75 U/L (ref 38–126)
Anion gap: 8 (ref 5–15)
BUN: 13 mg/dL (ref 8–23)
CO2: 24 mmol/L (ref 22–32)
Calcium: 8.5 mg/dL — ABNORMAL LOW (ref 8.9–10.3)
Chloride: 107 mmol/L (ref 98–111)
Creatinine, Ser: 0.83 mg/dL (ref 0.44–1.00)
GFR calc Af Amer: >60 mL/min (ref >60)
GFR calc non Af Amer: >60 mL/min (ref >60)
Glucose, Bld: 140 mg/dL — ABNORMAL HIGH (ref 70–99)
Potassium: 3.9 mmol/L (ref 3.5–5.1)
Sodium: 139 mmol/L (ref 135–145)
Total Bilirubin: 0.6 mg/dL (ref 0.3–1.2)
Total Protein: 6 g/dL — ABNORMAL LOW (ref 6.5–8.1)

## 2020-02-09 LAB — ABO/RH: ABO/RH(D): O POS

## 2020-02-09 LAB — TYPE AND SCREEN
ABO/RH(D): O POS
Antibody Screen: NEGATIVE

## 2020-02-09 SURGERY — REPAIR, HERNIA, HIATAL, ROBOT-ASSISTED
Anesthesia: General

## 2020-02-09 MED ORDER — CHLORHEXIDINE GLUCONATE CLOTH 2 % EX PADS: 6.0000 | MEDICATED_PAD | Freq: Once | CUTANEOUS | Status: DC

## 2020-02-09 MED ORDER — OXYCODONE HCL 5 MG/5ML PO SOLN
5.0000 mg | ORAL | Status: DC | PRN
  Administered 2020-02-09: 5 mg via ORAL
  Filled 2020-02-09: qty 5

## 2020-02-09 MED ORDER — FENTANYL CITRATE (PF) 250 MCG/5ML IJ SOLN
INTRAMUSCULAR | Status: AC
  Filled 2020-02-09: qty 5

## 2020-02-09 MED ORDER — FENTANYL CITRATE (PF) 250 MCG/5ML IJ SOLN
INTRAMUSCULAR | Status: DC | PRN
  Administered 2020-02-09: 100 ug via INTRAVENOUS
  Administered 2020-02-09: 150 ug via INTRAVENOUS
  Administered 2020-02-09 (×3): 50 ug via INTRAVENOUS
  Administered 2020-02-09 (×2): 100 ug via INTRAVENOUS

## 2020-02-09 MED ORDER — DEXAMETHASONE SODIUM PHOSPHATE 4 MG/ML IJ SOLN: 4.0000 mg | INTRAMUSCULAR | Status: DC

## 2020-02-09 MED ORDER — BUPIVACAINE-EPINEPHRINE (PF) 0.25% -1:200000 IJ SOLN
INTRAMUSCULAR | Status: DC | PRN
  Administered 2020-02-09: 30 mL

## 2020-02-09 MED ORDER — GABAPENTIN 100 MG PO CAPS
100.0000 mg | ORAL_CAPSULE | ORAL | Status: AC
  Administered 2020-02-09: 100 mg via ORAL
  Filled 2020-02-09: qty 1

## 2020-02-09 MED ORDER — KETOROLAC TROMETHAMINE 15 MG/ML IJ SOLN
INTRAMUSCULAR | Status: DC | PRN
  Administered 2020-02-09: 15 mg via INTRAVENOUS

## 2020-02-09 MED ORDER — MEPERIDINE HCL 50 MG/ML IJ SOLN: 6.2500 mg | INTRAMUSCULAR | Status: DC | PRN

## 2020-02-09 MED ORDER — HYDROMORPHONE HCL 1 MG/ML IJ SOLN: 0.2500 mg | INTRAMUSCULAR | Status: DC | PRN

## 2020-02-09 MED ORDER — BUPIVACAINE-EPINEPHRINE (PF) 0.25% -1:200000 IJ SOLN
INTRAMUSCULAR | Status: AC
  Filled 2020-02-09: qty 30

## 2020-02-09 MED ORDER — ACETAMINOPHEN 10 MG/ML IV SOLN
1000.0000 mg | Freq: Four times a day (QID) | INTRAVENOUS | Status: DC
  Administered 2020-02-09 – 2020-02-10 (×3): 1000 mg via INTRAVENOUS
  Filled 2020-02-09 (×4): qty 100

## 2020-02-09 MED ORDER — KETAMINE HCL 10 MG/ML IJ SOLN
INTRAMUSCULAR | Status: DC | PRN
  Administered 2020-02-09: 30 mg via INTRAVENOUS

## 2020-02-09 MED ORDER — SIMETHICONE 80 MG PO CHEW: 40.0000 mg | CHEWABLE_TABLET | Freq: Four times a day (QID) | ORAL | Status: DC | PRN

## 2020-02-09 MED ORDER — ONDANSETRON HCL 4 MG/2ML IJ SOLN: 4.0000 mg | Freq: Once | INTRAMUSCULAR | Status: DC | PRN

## 2020-02-09 MED ORDER — NALOXONE HCL 0.4 MG/ML IJ SOLN
INTRAMUSCULAR | Status: AC
  Filled 2020-02-09: qty 1

## 2020-02-09 MED ORDER — NALOXONE HCL 0.4 MG/ML IJ SOLN
INTRAMUSCULAR | Status: DC | PRN
Start: 2020-02-09 — End: 2020-02-09
  Administered 2020-02-09: .1 ug via INTRAVENOUS

## 2020-02-09 MED ORDER — LACTATED RINGERS IR SOLN
Status: DC | PRN
  Administered 2020-02-09: 1000 mL

## 2020-02-09 MED ORDER — ROCURONIUM BROMIDE 10 MG/ML (PF) SYRINGE
PREFILLED_SYRINGE | INTRAVENOUS | Status: DC | PRN
  Administered 2020-02-09: 20 mg via INTRAVENOUS
  Administered 2020-02-09: 70 mg via INTRAVENOUS
  Administered 2020-02-09: 30 mg via INTRAVENOUS
  Administered 2020-02-09: 20 mg via INTRAVENOUS

## 2020-02-09 MED ORDER — LABETALOL HCL 5 MG/ML IV SOLN
INTRAVENOUS | Status: DC | PRN
  Administered 2020-02-09: 2.5 mg via INTRAVENOUS

## 2020-02-09 MED ORDER — PROPOFOL 10 MG/ML IV BOLUS
INTRAVENOUS | Status: DC | PRN
  Administered 2020-02-09: 20 mg via INTRAVENOUS
  Administered 2020-02-09: 30 mg via INTRAVENOUS
  Administered 2020-02-09: 150 mg via INTRAVENOUS

## 2020-02-09 MED ORDER — CHLORHEXIDINE GLUCONATE 0.12 % MT SOLN: 15.0000 mL | Freq: Once | OROMUCOSAL | Status: AC

## 2020-02-09 MED ORDER — METHOCARBAMOL 1000 MG/10ML IJ SOLN
500.0000 mg | Freq: Three times a day (TID) | INTRAVENOUS | Status: DC | PRN
  Filled 2020-02-09: qty 5

## 2020-02-09 MED ORDER — LORAZEPAM 2 MG/ML IJ SOLN: 1.0000 mg | Freq: Three times a day (TID) | INTRAMUSCULAR | Status: DC | PRN

## 2020-02-09 MED ORDER — PHENYLEPHRINE HCL-NACL 10-0.9 MG/250ML-% IV SOLN
INTRAVENOUS | Status: DC | PRN
  Administered 2020-02-09: 25 ug/min via INTRAVENOUS

## 2020-02-09 MED ORDER — EPHEDRINE SULFATE 50 MG/ML IJ SOLN
INTRAMUSCULAR | Status: DC | PRN
Start: 2020-02-09 — End: 2020-02-09
  Administered 2020-02-09: 10 mg via INTRAVENOUS

## 2020-02-09 MED ORDER — DEXAMETHASONE SODIUM PHOSPHATE 10 MG/ML IJ SOLN
INTRAMUSCULAR | Status: DC | PRN
  Administered 2020-02-09: 6 mg via INTRAVENOUS

## 2020-02-09 MED ORDER — ONDANSETRON HCL 4 MG/2ML IJ SOLN
4.0000 mg | Freq: Four times a day (QID) | INTRAMUSCULAR | Status: DC | PRN
  Administered 2020-02-10: 4 mg via INTRAVENOUS
  Filled 2020-02-09: qty 2

## 2020-02-09 MED ORDER — ONDANSETRON HCL 4 MG/2ML IJ SOLN
INTRAMUSCULAR | Status: DC | PRN
  Administered 2020-02-09: 4 mg via INTRAVENOUS

## 2020-02-09 MED ORDER — EPHEDRINE 5 MG/ML INJ
INTRAVENOUS | Status: AC
  Filled 2020-02-09: qty 10

## 2020-02-09 MED ORDER — CEFAZOLIN SODIUM-DEXTROSE 2-4 GM/100ML-% IV SOLN
2.0000 g | INTRAVENOUS | Status: AC
  Administered 2020-02-09: 2 g via INTRAVENOUS
  Filled 2020-02-09: qty 100

## 2020-02-09 MED ORDER — KCL IN DEXTROSE-NACL 20-5-0.45 MEQ/L-%-% IV SOLN
INTRAVENOUS | Status: DC
  Filled 2020-02-09 (×2): qty 1000

## 2020-02-09 MED ORDER — LABETALOL HCL 5 MG/ML IV SOLN: INTRAVENOUS | Status: DC | PRN

## 2020-02-09 MED ORDER — FENTANYL CITRATE (PF) 100 MCG/2ML IJ SOLN: 25.0000 ug | INTRAMUSCULAR | Status: DC | PRN

## 2020-02-09 MED ORDER — MIDAZOLAM HCL 2 MG/2ML IJ SOLN
INTRAMUSCULAR | Status: AC
  Filled 2020-02-09: qty 2

## 2020-02-09 MED ORDER — LIDOCAINE 2% (20 MG/ML) 5 ML SYRINGE
INTRAMUSCULAR | Status: DC | PRN
  Administered 2020-02-09 (×2): 1.5 mg/kg/h via INTRAVENOUS

## 2020-02-09 MED ORDER — PROMETHAZINE HCL 25 MG/ML IJ SOLN: 12.5000 mg | Freq: Four times a day (QID) | INTRAMUSCULAR | Status: DC | PRN

## 2020-02-09 MED ORDER — HYDROMORPHONE HCL 2 MG/ML IJ SOLN
INTRAMUSCULAR | Status: AC
  Filled 2020-02-09: qty 1

## 2020-02-09 MED ORDER — PANTOPRAZOLE SODIUM 40 MG IV SOLR
40.0000 mg | Freq: Every day | INTRAVENOUS | Status: DC
  Administered 2020-02-09: 40 mg via INTRAVENOUS
  Filled 2020-02-09: qty 40

## 2020-02-09 MED ORDER — ORAL CARE MOUTH RINSE
15.0000 mL | Freq: Once | OROMUCOSAL | Status: AC
  Administered 2020-02-09: 15 mL via OROMUCOSAL

## 2020-02-09 MED ORDER — MIDAZOLAM HCL 2 MG/2ML IJ SOLN
INTRAMUSCULAR | Status: DC | PRN
  Administered 2020-02-09 (×2): 1 mg via INTRAVENOUS

## 2020-02-09 MED ORDER — SCOPOLAMINE 1 MG/3DAYS TD PT72
1.0000 | MEDICATED_PATCH | TRANSDERMAL | Status: DC
  Administered 2020-02-09: 1.5 mg via TRANSDERMAL
  Filled 2020-02-09: qty 1

## 2020-02-09 MED ORDER — ZOLPIDEM TARTRATE 5 MG PO TABS: 5.0000 mg | ORAL_TABLET | Freq: Every evening | ORAL | Status: DC | PRN

## 2020-02-09 MED ORDER — ENOXAPARIN SODIUM 40 MG/0.4ML ~~LOC~~ SOLN
40.0000 mg | SUBCUTANEOUS | Status: DC
  Administered 2020-02-10: 40 mg via SUBCUTANEOUS
  Filled 2020-02-09: qty 0.4

## 2020-02-09 MED ORDER — ENALAPRILAT 1.25 MG/ML IV SOLN
1.2500 mg | Freq: Four times a day (QID) | INTRAVENOUS | Status: DC | PRN
  Filled 2020-02-09 (×2): qty 1

## 2020-02-09 MED ORDER — LIDOCAINE HCL (CARDIAC) PF 100 MG/5ML IV SOSY
PREFILLED_SYRINGE | INTRAVENOUS | Status: DC | PRN
  Administered 2020-02-09: 50 mg via INTRAVENOUS

## 2020-02-09 MED ORDER — ONDANSETRON 4 MG PO TBDP: 4.0000 mg | ORAL_TABLET | Freq: Four times a day (QID) | ORAL | Status: DC | PRN

## 2020-02-09 MED ORDER — KETOROLAC TROMETHAMINE 15 MG/ML IJ SOLN
15.0000 mg | Freq: Three times a day (TID) | INTRAMUSCULAR | Status: DC | PRN
  Filled 2020-02-09: qty 1

## 2020-02-09 MED ORDER — BUPIVACAINE LIPOSOME 1.3 % IJ SUSP
INTRAMUSCULAR | Status: DC | PRN
  Administered 2020-02-09: 20 mL

## 2020-02-09 MED ORDER — HYDROMORPHONE HCL 1 MG/ML IJ SOLN
INTRAMUSCULAR | Status: DC | PRN
  Administered 2020-02-09: 1 mg via INTRAVENOUS

## 2020-02-09 MED ORDER — LACTATED RINGERS IV SOLN: INTRAVENOUS | Status: DC

## 2020-02-09 MED ORDER — HEPARIN SODIUM (PORCINE) 5000 UNIT/ML IJ SOLN
5000.0000 [IU] | Freq: Once | INTRAMUSCULAR | Status: AC
  Administered 2020-02-09: 5000 [IU] via SUBCUTANEOUS
  Filled 2020-02-09: qty 1

## 2020-02-09 MED ORDER — ACETAMINOPHEN 500 MG PO TABS
1000.0000 mg | ORAL_TABLET | ORAL | Status: AC
  Administered 2020-02-09: 1000 mg via ORAL
  Filled 2020-02-09: qty 2

## 2020-02-09 MED ORDER — SUGAMMADEX SODIUM 200 MG/2ML IV SOLN
INTRAVENOUS | Status: DC | PRN
  Administered 2020-02-09: 150 mg via INTRAVENOUS

## 2020-02-09 SURGICAL SUPPLY — 72 items
ADH SKN CLS APL DERMABOND .7 (GAUZE/BANDAGES/DRESSINGS) ×1
APL PRP STRL LF DISP 70% ISPRP (MISCELLANEOUS) ×1
APPLIER CLIP 5 13 M/L LIGAMAX5 (MISCELLANEOUS)
APPLIER CLIP ROT 10 11.4 M/L (STAPLE)
APR CLP MED LRG 11.4X10 (STAPLE)
APR CLP MED LRG 5 ANG JAW (MISCELLANEOUS)
BLADE SURG SZ11 CARB STEEL (BLADE) ×2 IMPLANT
CHLORAPREP W/TINT 26 (MISCELLANEOUS) ×2 IMPLANT
CLIP APPLIE 5 13 M/L LIGAMAX5 (MISCELLANEOUS) IMPLANT
CLIP APPLIE ROT 10 11.4 M/L (STAPLE) IMPLANT
COVER SURGICAL LIGHT HANDLE (MISCELLANEOUS) ×2 IMPLANT
COVER TIP SHEARS 8 DVNC (MISCELLANEOUS) IMPLANT
COVER TIP SHEARS 8MM DA VINCI (MISCELLANEOUS)
COVER WAND RF STERILE (DRAPES) ×2 IMPLANT
DECANTER SPIKE VIAL GLASS SM (MISCELLANEOUS) ×2 IMPLANT
DERMABOND ADVANCED (GAUZE/BANDAGES/DRESSINGS) ×1
DERMABOND ADVANCED .7 DNX12 (GAUZE/BANDAGES/DRESSINGS) ×1 IMPLANT
DRAIN PENROSE 0.5X18 (DRAIN) ×1 IMPLANT
DRAPE ARM DVNC X/XI (DISPOSABLE) ×4 IMPLANT
DRAPE COLUMN DVNC XI (DISPOSABLE) ×1 IMPLANT
DRAPE DA VINCI XI ARM (DISPOSABLE) ×8
DRAPE DA VINCI XI COLUMN (DISPOSABLE) ×2
DRAPE SHEET LG 3/4 BI-LAMINATE (DRAPES) ×1 IMPLANT
ELECT REM PT RETURN 15FT ADLT (MISCELLANEOUS) ×2 IMPLANT
ENDOLOOP SUT PDS II  0 18 (SUTURE)
ENDOLOOP SUT PDS II 0 18 (SUTURE) IMPLANT
GAUZE 4X4 16PLY RFD (DISPOSABLE) ×2 IMPLANT
GLOVE BIO SURGEON STRL SZ 6 (GLOVE) ×6 IMPLANT
GLOVE INDICATOR 6.5 STRL GRN (GLOVE) ×6 IMPLANT
GOWN STRL REUS W/TWL LRG LVL3 (GOWN DISPOSABLE) ×6 IMPLANT
GOWN STRL REUS W/TWL XL LVL3 (GOWN DISPOSABLE) IMPLANT
GRASPER SUT TROCAR 14GX15 (MISCELLANEOUS) ×1 IMPLANT
KIT BASIN OR (CUSTOM PROCEDURE TRAY) ×2 IMPLANT
KIT TURNOVER KIT A (KITS) IMPLANT
LUBRICANT JELLY K Y 4OZ (MISCELLANEOUS) IMPLANT
MARKER SKIN DUAL TIP RULER LAB (MISCELLANEOUS) ×2 IMPLANT
NDL INSUFFLATION 14GA 120MM (NEEDLE) ×1 IMPLANT
NEEDLE HYPO 22GX1.5 SAFETY (NEEDLE) ×2 IMPLANT
NEEDLE INSUFFLATION 14GA 120MM (NEEDLE) ×2 IMPLANT
PACK CARDIOVASCULAR III (CUSTOM PROCEDURE TRAY) ×2 IMPLANT
PAD POSITIONING PINK XL (MISCELLANEOUS) ×2 IMPLANT
SCISSORS LAP 5X35 DISP (ENDOMECHANICALS) IMPLANT
SEAL CANN UNIV 5-8 DVNC XI (MISCELLANEOUS) ×4 IMPLANT
SEAL XI 5MM-8MM UNIVERSAL (MISCELLANEOUS) ×8
SEALER VESSEL DA VINCI XI (MISCELLANEOUS) ×2
SEALER VESSEL EXT DVNC XI (MISCELLANEOUS) ×1 IMPLANT
SET IRRIG TUBING LAPAROSCOPIC (IRRIGATION / IRRIGATOR) ×2 IMPLANT
SOL ANTI FOG 6CC (MISCELLANEOUS) ×1 IMPLANT
SOLUTION ANTI FOG 6CC (MISCELLANEOUS) ×1
SOLUTION ELECTROLUBE (MISCELLANEOUS) ×2 IMPLANT
STAPLER CANNULA SEAL DVNC XI (STAPLE) IMPLANT
STAPLER CANNULA SEAL XI (STAPLE) ×2
SUT ETHIBOND 0 36 GRN (SUTURE) ×4 IMPLANT
SUT ETHIBOND 2 0 SH (SUTURE) ×6
SUT ETHIBOND 2 0 SH 36X2 (SUTURE) IMPLANT
SUT MNCRL AB 4-0 PS2 18 (SUTURE) ×2 IMPLANT
SUT SILK 0 SH 30 (SUTURE) IMPLANT
SUT SILK 2 0 SH (SUTURE) IMPLANT
SUT VIC AB 0 UR5 27 (SUTURE) ×1 IMPLANT
SUT VICRYL 0 TIES 12 18 (SUTURE) ×1 IMPLANT
SUT VICRYL 0 UR6 27IN ABS (SUTURE) ×1 IMPLANT
SYR 20ML LL LF (SYRINGE) ×2 IMPLANT
TIP INNERVISION DETACH 40FR (MISCELLANEOUS) IMPLANT
TIP INNERVISION DETACH 50FR (MISCELLANEOUS) IMPLANT
TIP INNERVISION DETACH 56FR (MISCELLANEOUS) ×1 IMPLANT
TIPS INNERVISION DETACH 40FR (MISCELLANEOUS)
TOWEL OR 17X26 10 PK STRL BLUE (TOWEL DISPOSABLE) ×2 IMPLANT
TOWEL OR NON WOVEN STRL DISP B (DISPOSABLE) ×2 IMPLANT
TRAY FOLEY MTR SLVR 16FR STAT (SET/KITS/TRAYS/PACK) IMPLANT
TROCAR ADV FIXATION 12X100MM (TROCAR) ×1 IMPLANT
TROCAR ADV FIXATION 5X100MM (TROCAR) ×2 IMPLANT
TUBING INSUFFLATION 10FT LAP (TUBING) ×2 IMPLANT

## 2020-02-09 NOTE — Interval H&P Note (Signed)
History and Physical Interval Note:  02/09/2020 12:52 PM  Michele Flores  has presented today for surgery, with the diagnosis of HIATAL HERNIA/GERD.  The various methods of treatment have been discussed with the patient and family. After consideration of risks, benefits and other options for treatment, the patient has consented to  Procedure(s): XI ROBOTIC ASSISTED HIATAL HERNIA REPAIR WITH PARTIAL FUNDOPLICATION (N/A) UPPER GI ENDOSCOPY (N/A) as a surgical intervention.  The patient's history has been reviewed, patient examined, no change in status, stable for surgery.  I have reviewed the patient's chart and labs.  Questions were answered to the patient's satisfaction.    [name], MD, [name] General, Bariatric, & Minimally Invasive Surgery [facility]   Gaynelle Adu

## 2020-02-09 NOTE — Transfer of Care (Signed)
Immediate Anesthesia Transfer of Care Note  Patient: Michele Flores  Procedure(s) Performed: XI ROBOTIC ASSISTED HIATAL HERNIA REPAIR WITH PARTIAL TOUPETFUNDOPLICATION (N/A ) UPPER GI ENDOSCOPY (N/A )  Patient Location: PACU  Anesthesia Type:General  Level of Consciousness: awake, alert , oriented and patient cooperative  Airway & Oxygen Therapy: Patient Spontanous Breathing and Patient connected to face mask oxygen  Post-op Assessment: Report given to RN and Post -op Vital signs reviewed and stable  Post vital signs: stable  Last Vitals:  Vitals Value Taken Time  BP 144/85 02/09/20 1836  Temp 36.3 C 02/09/20 1836  Pulse 74 02/09/20 1844  Resp 20 02/09/20 1844  SpO2 100 % 02/09/20 1844  Vitals shown include unvalidated device data.  Last Pain:  Vitals:   02/09/20 1836  TempSrc:   PainSc: 0-No pain         Complications: No complications documented.

## 2020-02-09 NOTE — Anesthesia Procedure Notes (Signed)
Procedure Name: Intubation Date/Time: 02/09/2020 2:47 PM Performed by: Lissa Morales, CRNA Pre-anesthesia Checklist: Patient identified, Emergency Drugs available, Suction available and Patient being monitored Patient Re-evaluated:Patient Re-evaluated prior to induction Oxygen Delivery Method: Circle system utilized Preoxygenation: Pre-oxygenation with 100% oxygen Induction Type: IV induction Ventilation: Mask ventilation without difficulty Laryngoscope Size: Mac and 4 Grade View: Grade I Tube type: Oral Tube size: 7.0 mm Number of attempts: 1 Airway Equipment and Method: Stylet and Oral airway Placement Confirmation: ETT inserted through vocal cords under direct vision,  positive ETCO2 and breath sounds checked- equal and bilateral Secured at: 20.5 cm Tube secured with: Tape Dental Injury: Teeth and Oropharynx as per pre-operative assessment

## 2020-02-09 NOTE — Anesthesia Preprocedure Evaluation (Signed)
Anesthesia Evaluation  Patient identified by MRN, date of birth, ID band Patient awake    Reviewed: Allergy & Precautions, NPO status , Patient's Chart, lab work & pertinent test results  Airway Mallampati: I  TM Distance: >3 FB Neck ROM: Full    Dental   Pulmonary former smoker,    Pulmonary exam normal        Cardiovascular hypertension, Pt. on medications Normal cardiovascular exam     Neuro/Psych Anxiety Depression    GI/Hepatic   Endo/Other    Renal/GU      Musculoskeletal   Abdominal   Peds  Hematology   Anesthesia Other Findings   Reproductive/Obstetrics                             Anesthesia Physical Anesthesia Plan  ASA: II  Anesthesia Plan: General   Post-op Pain Management:    Induction: Intravenous  PONV Risk Score and Plan: 3 and Ondansetron, Midazolam and Dexamethasone  Airway Management Planned: Oral ETT  Additional Equipment:   Intra-op Plan:   Post-operative Plan: Extubation in OR  Informed Consent: I have reviewed the patients History and Physical, chart, labs and discussed the procedure including the risks, benefits and alternatives for the proposed anesthesia with the patient or authorized representative who has indicated his/her understanding and acceptance.       Plan Discussed with: CRNA and Surgeon  Anesthesia Plan Comments:         Anesthesia Quick Evaluation  

## 2020-02-09 NOTE — Op Note (Signed)
Michele Flores, Michele Flores MEDICAL RECORD [id number] ACCOUNT [id number] DATE OF BIRTH:09/23/1940 FACILITY: [facility] LOCATION: [facility] PHYSICIAN:Challis Crill Elson Clan, MD  OPERATIVE REPORT  DATE OF PROCEDURE:  02/09/2020  PREOPERATIVE DIAGNOSES: 1.  Hiatal hernia. 2.  Gastroesophageal reflux disease. 3.  Epigastric pain.  POSTOPERATIVE DIAGNOSES: 1.  Hiatal hernia, type 2. 2.  Gastroesophageal reflux disease. 3.  Epigastric pain.  PROCEDURE: 1.  Xi robotic hiatal hernia repair with Toupet fundoplication. 2.  Upper endoscopy. 3.  Laparoscopic bilateral TAP block.  SURGEON:  [name], MD  ASSISTANT:  Ovidio Kin, MD, [name].  (An assistant was needed due to the complexity of the anatomy, to aid with anatomic identification, manipulation of tissue, and passage of needles and instruments).  ANESTHESIA:  General.  ESTIMATED BLOOD LOSS:  25 mL.  SPECIMENS:  Hernia sac and esophageal fat pad, which was discarded.  INDICATIONS:  The patient is a pleasant 79 year old female who has had longstanding history of epigastric pain, which is managed quite well with Pepcid and p.r.n. Prilosec.  She does have breakthrough symptoms at times.  Her biggest complaint is  epigastric or upper abdominal discomfort.  She did not really have a sensation of heartburn or burning in her chest.  Upper endoscopy by her primary gastroenterologist revealed a 5 cm hiatal hernia and she was referred for surgical evaluation  since she  was also interested in getting off heartburn medication.  She ultimately underwent a pH probe and manometry study.  Her manometry revealed normal motility and a 5 cm hiatal hernia.  She had normal peristalsis and normal bolus clearance.  Her pH probe  revealed 59 episodes of acid exposure with a DeMeester score of 44.9.  We had 2 in-person consultations, as well as a phone conversation.  That was quite extensive regarding the anatomy, indications for surgery, surgical options, as well as  the risks and  benefits, as well as the potential postoperative issues such as dysphagia, gas bloat, hiatal hernia recurrence, diarrhea, dysphagia, persistent heartburn, slipped wrap, etc.  She was concerned about postoperative dysphagia and because of her age, we  decided on a partial wrap or Toupet wrap as opposed to a full wrap.  All this is separately documented.  DESCRIPTION OF PROCEDURE:  The patient was given oral Tylenol and gabapentin preprocedure as part of the ERAS protocol.  She was also given 5000 units of subcutaneous heparin.  She was taken to OR 2 at [facility] and placed supine on the  operating room table.  Her arms were tucked at her side with the appropriate padding.  General endotracheal anesthesia was established.  Sequential compression devices had been placed, along with a Foley catheter.  Her abdomen was prepped and draped in  the usual standard surgical fashion with ChloraPrep.  She received IV antibiotic prior to skin incision.  A surgical timeout was performed.  I decided to gain access to her abdomen in left upper quadrant in Palmer's point with a Veress needle.  I did not  have adequate saline drop test  after 2 attempts, so therefore, I decided to do a cut down and do a Hasson approach.  This was in the left paramedian location at the level of the umbilicus just to the left of it, incising the skin, grabbing the fascia  with Kochers and incising and then entered the abdominal cavity.  A pursestring suture was placed around the fascial edges with 0 Vicryl followed by an 8 mm Hasson robotic trocar.  The robotic scope  was advanced after pneumoperitoneum was established and  the abdominal cavity was surveilled.  There was a little bit of bleeding in the omentum, but only a trickle.  I went ahead and placed additional trocars essentially in a horizontal line across the midabdomen, slightly above the umbilicus, an 8 mm trocar  in the left midaxillary line, another 8 mm  trocar in the left midclavicular line, an 8 mm trocar in the right midclavicular line and an assistant trocar in the right upper quadrant.  Then, using laparoscopic instruments, we inspected for any type of  visceral injury.  There was no evidence of small bowel or colonic injury.  There was no mesenteric injury, so therefore, I decided to proceed with the case.  The patient was placed in reverse Trendelenburg.  At this point, the [facility] robot was brought in and  docked.  The camera was inserted and the upper abdominal anatomy was targeted.  We then attached the robotic arms to the other trocars.  We placed a Nathanson liver retractor underneath the left lobe of the liver to expose the hiatus with excellent  exposure.  My assistant stayed at the bedside and I went to the robotic console at this point.  The patient had a moderate-sized hiatal hernia defect.  I decided to incise the peritoneum anterior along the arch of the hiatus with the vessel sealer.  I  then took it across anteriorly toward the patient's right and got into the avascular plane, reducing the hernia sac anteriorly.  We then carried it over to the right crus of the diaphragm.  I incised the gastrohepatic ligament with the vessel sealer  while my assistant was retracting laterally.  Again, the peritoneum on the right crus was also incised with the vessel sealer and we got into the avascular space on this side and joined the anterior dissection plane with the right-sided dissection plane.   The esophagus had been identified.   We then continued over toward the left side of the crus of the diaphragm and again incised the peritoneum and gently swept esophagus medially and got into the avascular plane.  The parietal pleura edge was  identified both on the left and the right.  At this point, I decided to take down short gastrics, starting about midway down the greater curvature of the stomach.  My tip-up grasper was retracting on the omentum and my  assistant on the stomach and I  entered the lesser sac by taking down the short gastric vessels.  This continued all the way up toward the cardia and up to the left crus.  We were able to free the stomach from the diaphragm.  At this point, we lifted up the distal esophagus, upper  stomach and identified the confluence of the left and right crura posteriorly.  I was able to pass an instrument around at this point and my assistant placed a Penrose drain retrogastric and we threaded it and that way we could lift up the esophagus and  do our posterior mobilization in the mediastinum.  The posterior vagus was identified and preserved.  It was swept up towards the esophagus.  We took down the avascular attachments along the posterior plane.  The aorta had been identified and preserved.   We then looked anteriorly and got retrocardiac.  The anterior vagus was identified and preserved as well.  At this point, we had achieved full circumferential mobilization of the esophagus and we had 3 cm of intraabdominal esophageal length.  At this  point, I reapproximated the left and right crus of the diaphragm after reducing the pneumoperitoneum to 8 mmHg.  We reapproximated the left and right crura with 4 interrupted 0 Ethibond sutures, tying intracorporeally.  There was not tension on the  crural closure.  At this point, there appeared to be a snug fit around the esophagus.  I could pass one instrument through the hiatus.  I took down one more short gastric on the greater curve of the stomach.  At this point, we decided to do our wrap.   The fundus where the short gastric had been taken down was pulled behind the lower esophagus to the right side.  We then grabbed the fundus on the patient's left along the greater curve at the transected short gastric.  I was able to perform a shoeshine  maneuver.  This ensured that the wrap was not twisted.  The right part of the fundus was then fixed to the esophagus with 3 interrupted  2-0 Ethibond sutures after taking a seromuscular bite of the esophagus and a full thickness bite of the fundus on the  patient's right.  In a similar fashion, 3 sutures were placed on the left side between the fundus on the left side and the esophagus on the patient's left.  The 56-French bougie that had been placed prior to performing the wrap was then removed.  I  placed a posterior gastropexy suture between the posterior wrap to the crura.  I then placed another gastropexy suture on the patient's left from the fundus to the left anterior crus.  All needles were removed.  The Penrose drain had been removed.  I had  debrided the hernia sac and the esophageal fat pad, ensuring that we were away from the esophagus.  This had been done prior to performing the wrap.  This was discarded.  At this point, my assistant scrubbed out and performed an upper endoscopy.  The  endoscope was placed in the oropharynx and guided down the esophagus into the stomach.  Stomach was insufflated and then the scope was retroflexed, demonstrating an omega sign.  The patient had at this point what appeared to be a healed grade I valve, so  I was happy with my wrap.  The scope was detorsed and the stomach was decompressed of air.  It was withdrawn.  At this point, the robot was undocked and I scrubbed back in, along with my assistant.  The [facility] liver retractor was released and removed  from the abdominal cavity.  The 10 mm trocar that had been placed for my assistant to pass needles was removed.  The fascia was reapproximated with 2 interrupted 0 Vicryls with the PMI suture passer.  The Hasson trocar was removed and the fascial suture  was tied down, thus obliterating the fascial defect.  There was a little bit of weakness and an additional 0 Vicryl was placed with the PMI suture passer again with laparoscopic guidance.  Local was infiltrated around the fascial closures.  We had  already performed a laparoscopic bilateral TAP  block of Marcaine and Exparel at the beginning of the procedure.  All needle, instrument, sponge counts were correct x2.  There were no immediate complications.  The patient tolerated the procedure well.  VN/NUANCE  D:02/09/2020 T:02/09/2020 JOB:[id number]

## 2020-02-09 NOTE — Brief Op Note (Signed)
02/09/2020  6:35 PM  PATIENT:  Michele Flores  79 y.o. female  PRE-OPERATIVE DIAGNOSIS:  HIATAL HERNIA/GERD/Epigastric pain  POST-OPERATIVE DIAGNOSIS:  HIATAL HERNIA type II /GERD/Epigastric pain  PROCEDURE:  Procedure(s): XI ROBOTIC ASSISTED HIATAL HERNIA REPAIR WITH TOUPET FUNDOPLICATION (N/A) UPPER GI ENDOSCOPY (N/A) LAPAROSCOPIC BILATERAL TAP BLOCK  SURGEON:  Surgeon(s) and Role:    Gaynelle Adu, MD - Primary    * Ovidio Kin, MD - Assisting  PHYSICIAN ASSISTANT:   ASSISTANTS: Ovidio Kin MD   ANESTHESIA:   general  EBL:  25 mL   BLOOD ADMINISTERED:none  DRAINS: none   LOCAL MEDICATIONS USED:  MARCAINE    and OTHER exparel  SPECIMEN:  Source of Specimen:  hernia sac and esophageal fat pad  DISPOSITION OF SPECIMEN:  discarded  COUNTS:  YES  TOURNIQUET:  * No tourniquets in log *  DICTATION: .Other Dictation: Dictation Number [id number]  PLAN OF CARE: Admit for overnight observation  PATIENT DISPOSITION:  PACU - hemodynamically stable.   Delay start of Pharmacological VTE agent (>24hrs) due to surgical blood loss or risk of bleeding: no  [name], MD, [name] General, Bariatric, & Minimally Invasive Surgery [facility], [place]

## 2020-02-09 NOTE — Anesthesia Procedure Notes (Deleted)
Procedure Name: Intubation Date/Time: 02/09/2020 2:47 PM Performed by: Lissa Morales, CRNA Pre-anesthesia Checklist: Patient identified, Emergency Drugs available, Suction available and Patient being monitored Patient Re-evaluated:Patient Re-evaluated prior to induction Oxygen Delivery Method: Circle system utilized Preoxygenation: Pre-oxygenation with 100% oxygen Induction Type: IV induction Ventilation: Mask ventilation without difficulty Laryngoscope Size: Mac and 3 Grade View: Grade I Tube type: Oral Tube size: 7.0 mm Number of attempts: 1 Airway Equipment and Method: Stylet and Oral airway Placement Confirmation: ETT inserted through vocal cords under direct vision,  positive ETCO2 and breath sounds checked- equal and bilateral Secured at: 21 cm Tube secured with: Tape Dental Injury: Teeth and Oropharynx as per pre-operative assessment

## 2020-02-09 NOTE — Anesthesia Postprocedure Evaluation (Signed)
Anesthesia Post Note  Patient: Michele Flores  Procedure(s) Performed: XI ROBOTIC ASSISTED HIATAL HERNIA REPAIR WITH PARTIAL TOUPETFUNDOPLICATION (N/A ) UPPER GI ENDOSCOPY (N/A )     Patient location during evaluation: PACU Anesthesia Type: General Level of consciousness: awake and alert Pain management: pain level controlled Vital Signs Assessment: post-procedure vital signs reviewed and stable Respiratory status: spontaneous breathing, nonlabored ventilation, respiratory function stable and patient connected to nasal cannula oxygen Cardiovascular status: blood pressure returned to baseline and stable Postop Assessment: no apparent nausea or vomiting Anesthetic complications: no   No complications documented.  Last Vitals:  Vitals:   02/09/20 1900 02/09/20 1915  BP: (!) 156/94 (!) 162/91  Pulse: 76 75  Resp: 13 10  Temp:    SpO2: 94% 95%    Last Pain:  Vitals:   02/09/20 1915  TempSrc:   PainSc: 2                  Kaiven Vester DAVID

## 2020-02-10 ENCOUNTER — Encounter (HOSPITAL_COMMUNITY): Payer: Self-pay | Admitting: General Surgery

## 2020-02-10 ENCOUNTER — Observation Stay (HOSPITAL_COMMUNITY): Payer: Medicare PPO

## 2020-02-10 DIAGNOSIS — K219 Gastro-esophageal reflux disease without esophagitis: Secondary | ICD-10-CM | POA: Diagnosis not present

## 2020-02-10 DIAGNOSIS — Z9889 Other specified postprocedural states: Secondary | ICD-10-CM | POA: Diagnosis not present

## 2020-02-10 DIAGNOSIS — Z87891 Personal history of nicotine dependence: Secondary | ICD-10-CM | POA: Diagnosis not present

## 2020-02-10 DIAGNOSIS — K862 Cyst of pancreas: Secondary | ICD-10-CM | POA: Diagnosis not present

## 2020-02-10 DIAGNOSIS — K449 Diaphragmatic hernia without obstruction or gangrene: Secondary | ICD-10-CM | POA: Diagnosis not present

## 2020-02-10 LAB — BASIC METABOLIC PANEL
Anion gap: 8 (ref 5–15)
BUN: 12 mg/dL (ref 8–23)
CO2: 23 mmol/L (ref 22–32)
Calcium: 8.4 mg/dL — ABNORMAL LOW (ref 8.9–10.3)
Chloride: 105 mmol/L (ref 98–111)
Creatinine, Ser: 0.9 mg/dL (ref 0.44–1.00)
GFR calc Af Amer: >60 mL/min (ref >60)
GFR calc non Af Amer: >60 mL/min (ref >60)
Glucose, Bld: 155 mg/dL — ABNORMAL HIGH (ref 70–99)
Potassium: 5 mmol/L (ref 3.5–5.1)
Sodium: 136 mmol/L (ref 135–145)

## 2020-02-10 LAB — CBC
HCT: 40.4 % (ref 36.0–46.0)
Hemoglobin: 13.3 g/dL (ref 12.0–15.0)
MCH: 31.8 pg (ref 26.0–34.0)
MCHC: 32.9 g/dL (ref 30.0–36.0)
MCV: 96.7 fL (ref 80.0–100.0)
Platelets: 202 10*3/uL (ref 150–400)
RBC: 4.18 MIL/uL (ref 3.87–5.11)
RDW: 12.4 % (ref 11.5–15.5)
WBC: 10 10*3/uL (ref 4.0–10.5)
nRBC: 0 % (ref 0.0–0.2)

## 2020-02-10 MED ORDER — IOHEXOL 300 MG/ML  SOLN
50.0000 mL | Freq: Once | INTRAMUSCULAR | Status: AC | PRN
  Administered 2020-02-10: 50 mL via ORAL

## 2020-02-10 MED ORDER — ACETAMINOPHEN 500 MG PO TABS
1000.0000 mg | ORAL_TABLET | Freq: Three times a day (TID) | ORAL | 0 refills | Status: DC | PRN
Start: 2020-02-10 — End: 2021-11-10

## 2020-02-10 MED ORDER — LISINOPRIL 20 MG PO TABS
20.0000 mg | ORAL_TABLET | Freq: Every day | ORAL | Status: DC
  Administered 2020-02-10: 20 mg via ORAL
  Filled 2020-02-10: qty 1

## 2020-02-10 MED ORDER — DEXTROSE-NACL 5-0.45 % IV SOLN: INTRAVENOUS | Status: DC

## 2020-02-10 MED ORDER — ONDANSETRON HCL 4 MG PO TABS: 4.0000 mg | ORAL_TABLET | Freq: Three times a day (TID) | ORAL | 1 refills | Status: AC | PRN

## 2020-02-10 MED ORDER — OXYCODONE HCL 5 MG/5ML PO SOLN: 5.0000 mg | Freq: Four times a day (QID) | ORAL | 0 refills | Status: DC | PRN

## 2020-02-10 NOTE — Addendum Note (Signed)
Addendum  created 02/10/20 5366 by Illene Silver, CRNA   Intraprocedure Event edited

## 2020-02-10 NOTE — Discharge Summary (Signed)
Physician Discharge Summary  Michele Flores [id number] DOB: 24-Aug-1940 DOA: 02/09/2020  PCP: Esperanza Richters, PA-C  Admit date: 02/09/2020 Discharge date: 02/10/2020  Recommendations for Outpatient Follow-up:     Follow-up Information    Gaynelle Adu, MD. Go on 03/03/2020.   Specialty: General Surgery Why: at 11 AM; please arrive at 10:45 AM for postop check Contact information: [address] [place] [address] [phone]              Discharge Diagnoses:  1. Type II hiatal hernia with GERD 2. Hypertension  Surgical Procedure: Xi robotic hiatal hernia repair with toupet fundoplication/gastropexy, upper egd  Discharge Condition: good Disposition: home  Diet recommendation: full liquid  Filed Weights   02/09/20 1152  Weight: 73 kg     Hospital Course:  Patient was brought in for planned robotic repair of symptomatic hiatal hernia with partial fundoplication.  Enhanced recovery after surgery protocols were used.  She was maintained on preoperative and postoperative chemical VTE prophylaxis.  On postop day 0 she was started on clear liquids.  On postoperative day 1 she was tolerating a clear liquid diet.  Her vital signs are stable without fever or tachycardia.  She had minimal nausea.  Her pain was well controlled.  She underwent a swallow exam which demonstrated no hiatal hernia.  There was an intact wrap.  There was some opacification of the wrap.  I do not believe it was consistent with a leak.  She was advanced to full liquids which she tolerated.  We rediscussed discharge instructions as well as the discharge dietary guidelines.  We also discussed postoperative pain control.  I felt she was stable for discharge.  BP 130/67 (BP Location: Right Arm)   Pulse 60   Temp 98.2 F (36.8 C) (Oral)   Resp 18   Wt 73 kg   SpO2 94%   BMI 25.22 kg/m   Gen: alert, NAD, non-toxic appearing Pupils: equal, no scleral icterus Pulm: Lungs clear to auscultation,  symmetric chest rise CV: regular rate and rhythm Abd: soft, mild approp tender, nondistended. Bruising around all trocar sites. No cellulitis. No incisional hernia Ext: no edema, no calf tenderness Skin: no rash, no jaundice  Discharge Instructions  Discharge Instructions    Call MD for:   Complete by: As directed    Temperature >101   Call MD for:  hives   Complete by: As directed    Call MD for:  persistant dizziness or light-headedness   Complete by: As directed    Call MD for:  persistant nausea and vomiting   Complete by: As directed    Call MD for:  redness, tenderness, or signs of infection (pain, swelling, redness, odor or green/yellow discharge around incision site)   Complete by: As directed    Call MD for:  severe uncontrolled pain   Complete by: As directed    Diet full liquid   Complete by: As directed    Discharge instructions   Complete by: As directed    See [facility] discharge instructions   Increase activity slowly   Complete by: As directed      Allergies as of 02/10/2020      Reactions   Codeine Nausea And Vomiting   Sleeping [diphenhydramine] Other (See Comments)   All sleeping medication reverse       Medication List    STOP taking these medications   ramelteon 8 MG tablet Commonly known as: ROZEREM   sertraline 25 MG  tablet Commonly known as: Zoloft     TAKE these medications   acetaminophen 500 MG tablet Commonly known as: TYLENOL Take 2 tablets (1,000 mg total) by mouth every 8 (eight) hours as needed for mild pain.   alprazolam 2 MG tablet Commonly known as: XANAX Take 1 mg by mouth at bedtime.   famotidine 20 MG tablet Commonly known as: PEPCID Take 20 mg by mouth daily as needed.   hydrocortisone 2.5 % cream Apply 1 application topically 2 (two) times daily.   lisinopril 20 MG tablet Commonly known as: ZESTRIL Take 20 mg by mouth daily.   omeprazole 20 MG capsule Commonly known as: PRILOSEC Take 20 mg by mouth daily. Time  released   ondansetron 4 MG tablet Commonly known as: Zofran Take 1 tablet (4 mg total) by mouth every 8 (eight) hours as needed for nausea or vomiting.   oxyCODONE 5 MG/5ML solution Commonly known as: ROXICODONE Take 5 mLs (5 mg total) by mouth every 6 (six) hours as needed for severe pain.       Follow-up Information    Gaynelle Adu, MD. Go on 03/03/2020.   Specialty: General Surgery Why: at 11 AM; please arrive at 10:45 AM for postop check Contact information: [address] [place] [address] [phone]                The results of significant diagnostics from this hospitalization (including imaging, microbiology, ancillary and laboratory) are listed below for reference.    Significant Diagnostic Studies: DG UGI W SINGLE CM (SOL OR THIN BA)  Result Date: 02/10/2020 CLINICAL DATA:  Status post Nissen fundoplication. EXAM: WATER SOLUBLE UPPER GI SERIES TECHNIQUE: Single-column upper GI series was performed using water soluble contrast. CONTRAST:  40mL OMNIPAQUE IOHEXOL 300 MG/ML  SOLN COMPARISON:  None. FLUOROSCOPY TIME:  Fluoroscopy Time:  1 minutes 18 seconds Radiation Exposure Index (if provided by the fluoroscopic device): 25 mGy Number of Acquired Spot Images: 0 FINDINGS: The patient ingested 50 cc of Omnipaque 300. The esophagus is patent. There is no evidence for obstructive process. The ingested contrast easily passes through the fundoplication wrap and into the stomach. A small collection of enteric contrast material is identified just medial to the wrap, which is favored to represent contrast filling a fold of the fundoplication wrap. Contrast extravasation secondary to leak is considered less favored. IMPRESSION: 1. Patent fundoplication wrap without evidence for obstruction. 2. A small collection of contrast is identified medial to the fundoplication wrap which is favored to represent contrast filling within a fold of the fundoplication wrap.  Electronically Signed   By: Signa Kell M.D.   On: 02/10/2020 09:25    Microbiology: Recent Results (from the past 240 hour(s))  SARS CORONAVIRUS 2 (TAT 6-24 HRS) Nasopharyngeal Nasopharyngeal Swab     Status: None   Collection Time: 02/05/20  8:23 AM   Specimen: Nasopharyngeal Swab  Result Value Ref Range Status   SARS Coronavirus 2 NEGATIVE NEGATIVE Final    Comment: (NOTE) SARS-CoV-2 target nucleic acids are NOT DETECTED.  The SARS-CoV-2 RNA is generally detectable in upper and lower respiratory specimens during the acute phase of infection. Negative results do not preclude SARS-CoV-2 infection, do not rule out co-infections with other pathogens, and should not be used as the sole basis for treatment or other patient management decisions. Negative results must be combined with clinical observations, patient history, and epidemiological information. The expected result is Negative.  Fact Sheet for Patients: HairSlick.no  Fact Sheet for Healthcare Providers: quierodirigir.com  This test is not yet approved or cleared by the [place] FDA and  has been authorized for detection and/or diagnosis of SARS-CoV-2 by FDA under an Emergency Use Authorization (EUA). This EUA will remain  in effect (meaning this test can be used) for the duration of the COVID-19 declaration under Se ction 564(b)(1) of the Act, 21 U.S.C. section 360bbb-3(b)(1), unless the authorization is terminated or revoked sooner.  Performed at [facility], [address] [place], [place] [address]      Labs: Basic Metabolic Panel: Recent Labs  Lab 02/04/20 0932 02/09/20 1847 02/10/20 0453  NA 139 139 136  K 4.4 3.9 5.0  CL 102 107 105  CO2 30 24 23   GLUCOSE 105* 140* 155*  BUN 17 13 12   CREATININE 0.98 0.83 0.90  CALCIUM 9.5 8.5* 8.4*   Liver Function Tests: Recent Labs  Lab 02/09/20 1847  AST 45*  ALT 37  ALKPHOS 75  BILITOT  0.6  PROT 6.0*  ALBUMIN 3.5   No results for input(s): LIPASE, AMYLASE in the last 168 hours. No results for input(s): AMMONIA in the last 168 hours. CBC: Recent Labs  Lab 02/04/20 0932 02/09/20 1847 02/10/20 0453  WBC 7.0 12.1* 10.0  NEUTROABS  --  10.9*  --   HGB 16.3* 14.8 13.3  HCT 49.5* 44.2 40.4  MCV 97.1 96.5 96.7  PLT 270 232 202   Cardiac Enzymes: No results for input(s): CKTOTAL, CKMB, CKMBINDEX, TROPONINI in the last 168 hours. BNP: BNP (last 3 results) No results for input(s): BNP in the last 8760 hours.  ProBNP (last 3 results) No results for input(s): PROBNP in the last 8760 hours.  CBG: No results for input(s): GLUCAP in the last 168 hours.  Active Problems:   S/P repair of paraesophageal hernia   Time coordinating discharge: 15 min  Signed:  04/10/20, MD [facility], [place] [phone] 02/10/2020, 12:44 PM

## 2020-02-10 NOTE — Progress Notes (Signed)
Discharge instructions discussed with patient, verbalized agreement and understanding 

## 2020-02-10 NOTE — Discharge Instructions (Signed)
EATING AFTER YOUR ESOPHAGEAL SURGERY (Stomach Fundoplication, Hiatal Hernia repair, Achalasia surgery, etc)  ######################################################################  EAT Start with a pureed / full liquid diet (see below) Gradually transition to a high fiber diet with a fiber supplement over the next month after discharge.    WALK Walk an hour a day.  Control your pain to do that.    CONTROL PAIN Control pain so that you can walk, sleep, tolerate sneezing/coughing, go up/down stairs.  HAVE A BOWEL MOVEMENT DAILY Keep your bowels regular to avoid problems.  OK to try a laxative to override constipation.  OK to use an antidairrheal to slow down diarrhea.  Call if not better after 2 tries  CALL IF YOU HAVE PROBLEMS/CONCERNS Call if you are still struggling despite following these instructions. Call if you have concerns not answered by these instructions  ######################################################################   After your esophageal surgery, expect some sticking with swallowing over the next 1-2 months.    If food sticks when you eat, it is called "dysphagia".  This is due to swelling around your esophagus at the wrap & hiatal diaphragm repair.  It will gradually ease off over the next few months.  To help you through this temporary phase, we start you out on a pureed (blenderized) diet.  Your first meal in the hospital was thin liquids.  You should have been given a full liquid diet by the time you left the hospital.  We ask patients to stay on a full liquid  diet for the first few days and then transition to pureed diet for 2 weeks to avoid anything getting "stuck" near your recent surgery.  Don't be alarmed if your ability to swallow doesn't progress according to this plan.  Everyone is different and some diets can advance more or less quickly.    It is often helpful to crush your medications or split them as they can sometimes stick, especially the first  week or so.   Some BASIC RULES to follow are:  Maintain an upright position whenever eating or drinking.  Take small bites - just a teaspoon size bite at a time.  Eat slowly.  It may also help to eat only one food at a time.  Consider nibbling through smaller, more frequent meals & avoid the urge to eat BIG meals  Do not push through feelings of fullness, nausea, or bloatedness  Do not mix solid foods and liquids in the same mouthful  Try not to "wash foods down" with large gulps of liquids.  Avoid carbonated (bubbly/fizzy) drinks.    Avoid foods that make you feel gassy or bloated.  Start with bland foods first.  Wait on trying greasy, fried, or spicy meals until you are tolerating more bland solids well.  Understand that it will be hard to burp and belch at first.  This gradually improves with time.  Expect to be more gassy/flatulent/bloated initially.  Walking will help your body manage it better.  Consider using medications for bloating that contain simethicone such as  Maalox or Gas-X   Consider crushing her medications, especially smaller pills.  The ability to swallow pills should get easier after a few weeks  Eat in a relaxed atmosphere & minimize distractions.  Avoid talking while eating.    Do not use straws.  Following each meal, sit in an upright position (90 degree angle) for 60 to 90 minutes.  Going for a short walk can help as well  If food does stick, don't panic.  Try to relax and let the food pass on its own.  Sipping WARM LIQUID such as strong hot black tea can also help slide it down.  You may do better with warm or room temperature liquids than cold liquids   Be gradual in changes & use common sense:  -If you easily tolerating a certain "level" of foods, advance to the next level gradually -If you are having trouble swallowing a particular food, then avoid it.   -If food is sticking when you advance your diet, go back to thinner previous diet (the  lower LEVEL) for 1-2 days.  LEVEL 1 = PUREED DIET  Do for the first 2 WEEKS AFTER SURGERY  -Foods in this group are pureed or blenderized to a smooth, mashed potato-like consistency.  -If necessary, the pureed foods can keep their shape with the addition of a thickening agent.   -Meat should be pureed to a smooth, pasty consistency.  Hot broth or gravy may be added to the pureed meat, approximately 1 oz. of liquid per 3 oz. serving of meat. -CAUTION:  If any foods do not puree into a smooth consistency, swallowing will be more difficult.  (For example, nuts or seeds sometimes do not blend well.)  Hot Foods Cold Foods  Pureed scrambled eggs and cheese Pureed cottage cheese  Baby cereals Thickened juices and nectars  Thinned cooked cereals (no lumps) Thickened milk or eggnog  Pureed [place] toast or pancakes Ensure  Mashed potatoes Ice cream  Pureed parsley, au gratin, scalloped potatoes, candied sweet potatoes Fruit or [place] ice, sherbet  Pureed buttered or alfredo noodles Plain yogurt  Pureed vegetables (no corn or peas) Instant breakfast  Pureed soups and creamed soups Smooth pudding, mousse, custard  Pureed scalloped apples Whipped gelatin  Gravies Sugar, syrup, honey, jelly  Sauces, cheese, tomato, barbecue, white, creamed Cream  Any baby food Creamer  Alcohol in moderation (not beer or champagne) Margarine  Coffee or tea Mayonnaise   Ketchup, mustard   Apple sauce   SAMPLE MENU:  PUREED DIET Breakfast Lunch Dinner   Orange juice, 1/2 cup  Cream of wheat, 1/2 cup  Pineapple juice, 1/2 cup  Pureed [place], barley soup, 3/4 cup  Pureed Hawaiian chicken, 3 oz   Scrambled eggs, mashed or blended with cheese, 1/2 cup  Tea or coffee, 1 cup   Whole milk, 1 cup   Non-dairy creamer, 2 Tbsp.  Mashed potatoes, 1/2 cup  Pureed cooled broccoli, 1/2 cup  Apple sauce, 1/2 cup  Coffee or tea  Mashed potatoes, 1/2 cup  Pureed spinach, 1/2 cup  Frozen yogurt, 1/2  cup  Tea or coffee      LEVEL 2 = SOFT DIET  After your first 2 weeks, you can advance to a soft diet.   Keep on this diet until everything goes down easily.  Hot Foods Cold Foods  White fish Cottage cheese  Stuffed fish Junior baby fruit  Baby food meals Semi thickened juices  Minced soft cooked, scrambled, poached eggs nectars  Souffle & omelets Ripe mashed bananas  Cooked cereals Canned fruit, pineapple sauce, milk  potatoes Milkshake  Buttered or Alfredo noodles Custard  Cooked cooled vegetable Puddings, including tapioca  Sherbet Yogurt  Vegetable soup or alphabet soup Fruit ice, [place] ice  Gravies Whipped gelatin  Sugar, syrup, honey, jelly Junior baby desserts  Sauces:  Cheese, creamed, barbecue, tomato, white Cream  Coffee or tea Margarine   SAMPLE MENU:  LEVEL 2 Breakfast [employer]  Orange juice, 1/2 cup  Oatmeal, 1/2 cup  Scrambled eggs with cheese, 1/2 cup  Decaffeinated tea, 1 cup  Whole milk, 1 cup  Non-dairy creamer, 2 Tbsp  Pineapple juice, 1/2 cup  Minced beef, 3 oz  Gravy, 2 Tbsp  Mashed potatoes, 1/2 cup  Minced fresh broccoli, 1/2 cup  Applesauce, 1/2 cup  Coffee, 1 cup  [place], barley soup, 3/4 cup  Minced Hawaiian chicken, 3 oz  Mashed potatoes, 1/2 cup  Cooked spinach, 1/2 cup  Frozen yogurt, 1/2 cup  Non-dairy creamer, 2 Tbsp      LEVEL 3 = CHOPPED DIET  -After all the foods in level 2 (soft diet) are passing through well you should advance up to more chopped foods.  -It is still important to cut these foods into small pieces and eat slowly.  Hot Foods Cold Foods  Poultry Cottage cheese  Chopped Swedish meatballs Yogurt  Meat salads (ground or flaked meat) Milk  Flaked fish (tuna) Milkshakes  Poached or scrambled eggs Soft, cold, dry cereal  Souffles and omelets Fruit juices or nectars  Cooked cereals Chopped canned fruit  Chopped [place] toast or pancakes Canned fruit cocktail  Noodles or pasta (no rice)  Pudding, mousse, custard  Cooked vegetables (no frozen peas, corn, or mixed vegetables) Green salad  Canned small sweet peas Ice cream  Creamed soup or vegetable soup Fruit ice, [place] ice  Pureed vegetable soup or alphabet soup Non-dairy creamer  Ground scalloped apples Margarine  Gravies Mayonnaise  Sauces:  Cheese, creamed, barbecue, tomato, white Ketchup  Coffee or tea Mustard   SAMPLE MENU:  LEVEL 3 Breakfast Lunch Dinner   Orange juice, 1/2 cup  Oatmeal, 1/2 cup  Scrambled eggs with cheese, 1/2 cup  Decaffeinated tea, 1 cup  Whole milk, 1 cup  Non-dairy creamer, 2 Tbsp  Ketchup, 1 Tbsp  Margarine, 1 tsp  Salt, 1/4 tsp  Sugar, 2 tsp  Pineapple juice, 1/2 cup  Ground beef, 3 oz  Gravy, 2 Tbsp  Mashed potatoes, 1/2 cup  Cooked spinach, 1/2 cup  Applesauce, 1/2 cup  Decaffeinated coffee  Whole milk  Non-dairy creamer, 2 Tbsp  Margarine, 1 tsp  Salt, 1/4 tsp  Pureed [place], barley soup, 3/4 cup  Barbecue chicken, 3 oz  Mashed potatoes, 1/2 cup  Ground fresh broccoli, 1/2 cup  Frozen yogurt, 1/2 cup  Decaffeinated tea, 1 cup  Non-dairy creamer, 2 Tbsp  Margarine, 1 tsp  Salt, 1/4 tsp  Sugar, 1 tsp    LEVEL 4:  REGULAR FOODS  -Foods in this group are soft, moist, regularly textured foods.   -This level includes meat and breads, which tend to be the hardest things to swallow.   -Eat very slowly, chew well and continue to avoid carbonated drinks. -most people are at this level in 4-6 weeks  Hot Foods Cold Foods  Baked fish or skinned Soft cheeses - cottage cheese  Souffles and omelets Cream cheese  Eggs Yogurt  Stuffed shells Milk  Spaghetti with meat sauce Milkshakes  Cooked cereal Cold dry cereals (no nuts, dried fruit, coconut)  [place] toast or pancakes Crackers  Buttered toast Fruit juices or nectars  Noodles or pasta (no rice) Canned fruit  Potatoes (all types) Ripe bananas  Soft, cooked vegetables (no corn, lima, or baked  beans) Peeled, ripe, fresh fruit  Creamed soups or vegetable soup Cakes (no nuts, dried fruit, coconut)  Canned chicken noodle soup Plain doughnuts  Gravies Ice cream  Bacon dressing Pudding, mousse, custard  Sauces:  Cheese, creamed, barbecue, tomato, white Fruit ice, [place] ice, sherbet  Decaffeinated tea or coffee Whipped gelatin  Pork chops Regular gelatin   Canned fruited gelatin molds   Sugar, syrup, honey, jam, jelly   Cream   Non-dairy   Margarine   Oil   Mayonnaise   Ketchup   Mustard   TROUBLESHOOTING IRREGULAR BOWELS  1) Avoid extremes of bowel movements (no bad constipation/diarrhea)  2) Miralax 17gm mixed in 8oz. water or juice-daily. May use BID as needed.  3) Gas-x,Phazyme, etc. as needed for gas & bloating.  4) Soft,bland diet. No spicy,greasy,fried foods.  5) Prilosec over-the-counter as needed  6) May hold gluten/wheat products from diet to see if symptoms improve.  7) May try probiotics (Align, Activa, etc) to help calm the bowels down  7) If symptoms become worse call back immediately.    If you have any questions please call our office at [facility]: [phone].     Managing Your Pain After Surgery Without Opioids    Thank you for participating in our program to help patients manage their pain after surgery without opioids. This is part of our effort to provide you with the best care possible, without exposing you or your family to the risk that opioids pose.  What pain can I expect after surgery? You can expect to have some pain after surgery. This is normal. The pain is typically worse the day after surgery, and quickly begins to get better. Many studies have found that many patients are able to manage their pain after surgery with Over-the-Counter (OTC) medications such as Tylenol and Motrin. If you have a condition that does not allow you to take Tylenol or Motrin, notify your surgical team.  How will I manage my  pain? The best strategy for controlling your pain after surgery is around the clock pain control with Tylenol (acetaminophen) and Motrin (ibuprofen or Advil). Alternating these medications with each other allows you to maximize your pain control. In addition to Tylenol and Motrin, you can use heating pads or ice packs on your incisions to help reduce your pain.  How will I alternate your regular strength over-the-counter pain medication? You will take a dose of pain medication every three hours. ; Start by taking 650 mg of Tylenol (2 pills of 325 mg) ; 3 hours later take 600 mg of Motrin (3 pills of 200 mg) ; 3 hours after taking the Motrin take 650 mg of Tylenol ; 3 hours after that take 600 mg of Motrin.   - 1 -  See example - if your first dose of Tylenol is at 12:00 PM   12:00 PM Tylenol 650 mg (2 pills of 325 mg)  3:00 PM Motrin 600 mg (3 pills of 200 mg)  6:00 PM Tylenol 650 mg (2 pills of 325 mg)  9:00 PM Motrin 600 mg (3 pills of 200 mg)  Continue alternating every 3 hours   We recommend that you follow this schedule around-the-clock for at least 3 days after surgery, or until you feel that it is no longer needed. Use the table on the last page of this handout to keep track of the medications you are taking. Important: Do not take more than 3000mg  of Tylenol or 1800mg  of Motrin in a 24-hour period. Do not take ibuprofen/Motrin if you have a history of bleeding stomach ulcers, severe kidney disease, &/or actively taking a blood thinner  What if I still have pain? If you  have pain that is not controlled with the over-the-counter pain medications (Tylenol and Motrin or Advil) you might have what we call breakthrough pain. You will receive a prescription for a small amount of an opioid pain medication such as Oxycodone, Tramadol, or Tylenol with Codeine. Use these opioid pills in the first 24 hours after surgery if you have breakthrough pain. Do not take more than 1 pill every 4-6  hours.  If you still have uncontrolled pain after using all opioid pills, don't hesitate to call our staff using the number provided. We will help make sure you are managing your pain in the best way possible, and if necessary, we can provide a prescription for additional pain medication.   Day 1    Time  Name of Medication Number of pills taken  Amount of Acetaminophen  Pain Level   Comments  AM PM       AM PM       AM PM       AM PM       AM PM       AM PM       AM PM       AM PM       Total Daily amount of Acetaminophen Do not take more than  3,000 mg per day      Day 2    Time  Name of Medication Number of pills taken  Amount of Acetaminophen  Pain Level   Comments  AM PM       AM PM       AM PM       AM PM       AM PM       AM PM       AM PM       AM PM       Total Daily amount of Acetaminophen Do not take more than  3,000 mg per day      Day 3    Time  Name of Medication Number of pills taken  Amount of Acetaminophen  Pain Level   Comments  AM PM       AM PM       AM PM       AM PM          AM PM       AM PM       AM PM       AM PM       Total Daily amount of Acetaminophen Do not take more than  3,000 mg per day      Day 4    Time  Name of Medication Number of pills taken  Amount of Acetaminophen  Pain Level   Comments  AM PM       AM PM       AM PM       AM PM       AM PM       AM PM       AM PM       AM PM       Total Daily amount of Acetaminophen Do not take more than  3,000 mg per day      Day 5    Time  Name of Medication Number of pills taken  Amount of Acetaminophen  Pain Level   Comments  AM PM       AM PM  AM PM       AM PM       AM PM       AM PM       AM PM       AM PM       Total Daily amount of Acetaminophen Do not take more than  3,000 mg per day       Day 6    Time  Name of Medication Number of pills taken  Amount of Acetaminophen  Pain Level  Comments  AM PM       AM PM        AM PM       AM PM       AM PM       AM PM       AM PM       AM PM       Total Daily amount of Acetaminophen Do not take more than  3,000 mg per day      Day 7    Time  Name of Medication Number of pills taken  Amount of Acetaminophen  Pain Level   Comments  AM PM       AM PM       AM PM       AM PM       AM PM       AM PM       AM PM       AM PM       Total Daily amount of Acetaminophen Do not take more than  3,000 mg per day        For additional information about how and where to safely dispose of unused opioid medications - [email or web address]  Disclaimer: This document contains information and/or instructional materials adapted from [facility] for the typical patient with your condition. It does not replace medical advice from your health care provider because your experience may differ from that of the typical patient. Talk to your health care provider if you have any questions about this document, your condition or your treatment plan. Adapted from [facility]

## 2020-02-12 ENCOUNTER — Telehealth: Payer: Self-pay | Admitting: General Practice

## 2020-02-12 NOTE — Telephone Encounter (Signed)
Caller: [name]  Patient states she wants Michele Flores name not listed as a PCP. Patient states she already establish cared with Dr. Hyman Hopes. Patient states Ramon Dredge was not a good fit. Edward name was removed from pcp.

## 2020-02-24 ENCOUNTER — Ambulatory Visit: Payer: Medicare PPO | Admitting: Medical

## 2020-03-08 ENCOUNTER — Other Ambulatory Visit (HOSPITAL_COMMUNITY): Payer: Medicare PPO

## 2020-03-17 ENCOUNTER — Encounter: Payer: Self-pay | Admitting: Nurse Practitioner

## 2020-04-04 DIAGNOSIS — H903 Sensorineural hearing loss, bilateral: Secondary | ICD-10-CM | POA: Diagnosis not present

## 2020-04-27 ENCOUNTER — Ambulatory Visit: Payer: Medicare PPO | Admitting: Nurse Practitioner

## 2020-04-29 DIAGNOSIS — Z9889 Other specified postprocedural states: Secondary | ICD-10-CM | POA: Diagnosis not present

## 2020-04-29 DIAGNOSIS — K5900 Constipation, unspecified: Secondary | ICD-10-CM | POA: Diagnosis not present

## 2020-04-29 DIAGNOSIS — R109 Unspecified abdominal pain: Secondary | ICD-10-CM | POA: Diagnosis not present

## 2020-04-29 DIAGNOSIS — G8929 Other chronic pain: Secondary | ICD-10-CM | POA: Diagnosis not present

## 2020-04-29 DIAGNOSIS — R1310 Dysphagia, unspecified: Secondary | ICD-10-CM | POA: Diagnosis not present

## 2020-06-07 DIAGNOSIS — H903 Sensorineural hearing loss, bilateral: Secondary | ICD-10-CM | POA: Diagnosis not present

## 2020-06-08 DIAGNOSIS — Z9889 Other specified postprocedural states: Secondary | ICD-10-CM | POA: Diagnosis not present

## 2020-06-08 DIAGNOSIS — K219 Gastro-esophageal reflux disease without esophagitis: Secondary | ICD-10-CM | POA: Diagnosis not present

## 2020-06-08 DIAGNOSIS — R109 Unspecified abdominal pain: Secondary | ICD-10-CM | POA: Diagnosis not present

## 2020-06-08 DIAGNOSIS — K5900 Constipation, unspecified: Secondary | ICD-10-CM | POA: Diagnosis not present

## 2020-06-08 DIAGNOSIS — G8929 Other chronic pain: Secondary | ICD-10-CM | POA: Diagnosis not present

## 2020-08-09 DIAGNOSIS — Z1389 Encounter for screening for other disorder: Secondary | ICD-10-CM | POA: Diagnosis not present

## 2020-08-10 DIAGNOSIS — G8929 Other chronic pain: Secondary | ICD-10-CM | POA: Diagnosis not present

## 2020-08-10 DIAGNOSIS — R197 Diarrhea, unspecified: Secondary | ICD-10-CM | POA: Diagnosis not present

## 2020-08-10 DIAGNOSIS — K5790 Diverticulosis of intestine, part unspecified, without perforation or abscess without bleeding: Secondary | ICD-10-CM | POA: Diagnosis not present

## 2020-08-10 DIAGNOSIS — K449 Diaphragmatic hernia without obstruction or gangrene: Secondary | ICD-10-CM | POA: Diagnosis not present

## 2020-08-10 DIAGNOSIS — K5730 Diverticulosis of large intestine without perforation or abscess without bleeding: Secondary | ICD-10-CM | POA: Diagnosis not present

## 2020-08-10 DIAGNOSIS — D259 Leiomyoma of uterus, unspecified: Secondary | ICD-10-CM | POA: Diagnosis not present

## 2020-08-10 DIAGNOSIS — R109 Unspecified abdominal pain: Secondary | ICD-10-CM | POA: Diagnosis not present

## 2020-09-05 DIAGNOSIS — K581 Irritable bowel syndrome with constipation: Secondary | ICD-10-CM | POA: Diagnosis not present

## 2020-09-05 DIAGNOSIS — K219 Gastro-esophageal reflux disease without esophagitis: Secondary | ICD-10-CM | POA: Diagnosis not present

## 2020-09-05 DIAGNOSIS — Z9889 Other specified postprocedural states: Secondary | ICD-10-CM | POA: Diagnosis not present

## 2020-09-05 DIAGNOSIS — G8929 Other chronic pain: Secondary | ICD-10-CM | POA: Diagnosis not present

## 2020-09-05 DIAGNOSIS — R109 Unspecified abdominal pain: Secondary | ICD-10-CM | POA: Diagnosis not present

## 2020-09-21 DIAGNOSIS — Z1231 Encounter for screening mammogram for malignant neoplasm of breast: Secondary | ICD-10-CM | POA: Diagnosis not present

## 2020-09-26 DIAGNOSIS — Z01419 Encounter for gynecological examination (general) (routine) without abnormal findings: Secondary | ICD-10-CM | POA: Diagnosis not present

## 2020-09-26 DIAGNOSIS — Z9889 Other specified postprocedural states: Secondary | ICD-10-CM | POA: Diagnosis not present

## 2020-09-27 ENCOUNTER — Ambulatory Visit: Payer: Medicare PPO | Admitting: Medical

## 2020-10-06 DIAGNOSIS — R928 Other abnormal and inconclusive findings on diagnostic imaging of breast: Secondary | ICD-10-CM | POA: Diagnosis not present

## 2020-10-20 DIAGNOSIS — R921 Mammographic calcification found on diagnostic imaging of breast: Secondary | ICD-10-CM | POA: Diagnosis not present

## 2020-10-20 DIAGNOSIS — N6489 Other specified disorders of breast: Secondary | ICD-10-CM | POA: Diagnosis not present

## 2020-10-20 DIAGNOSIS — N6342 Unspecified lump in left breast, subareolar: Secondary | ICD-10-CM | POA: Diagnosis not present

## 2020-12-13 DIAGNOSIS — E785 Hyperlipidemia, unspecified: Secondary | ICD-10-CM | POA: Diagnosis not present

## 2020-12-13 DIAGNOSIS — I10 Essential (primary) hypertension: Secondary | ICD-10-CM | POA: Diagnosis not present

## 2020-12-19 DIAGNOSIS — Z5181 Encounter for therapeutic drug level monitoring: Secondary | ICD-10-CM | POA: Diagnosis not present

## 2020-12-19 DIAGNOSIS — I10 Essential (primary) hypertension: Secondary | ICD-10-CM | POA: Diagnosis not present

## 2020-12-19 DIAGNOSIS — F5101 Primary insomnia: Secondary | ICD-10-CM | POA: Diagnosis not present

## 2020-12-19 DIAGNOSIS — E538 Deficiency of other specified B group vitamins: Secondary | ICD-10-CM | POA: Diagnosis not present

## 2020-12-19 DIAGNOSIS — Z0000 Encounter for general adult medical examination without abnormal findings: Secondary | ICD-10-CM | POA: Diagnosis not present

## 2020-12-19 DIAGNOSIS — K862 Cyst of pancreas: Secondary | ICD-10-CM | POA: Diagnosis not present

## 2020-12-19 DIAGNOSIS — F331 Major depressive disorder, recurrent, moderate: Secondary | ICD-10-CM | POA: Diagnosis not present

## 2020-12-19 DIAGNOSIS — E785 Hyperlipidemia, unspecified: Secondary | ICD-10-CM | POA: Diagnosis not present

## 2021-03-03 DIAGNOSIS — K047 Periapical abscess without sinus: Secondary | ICD-10-CM | POA: Diagnosis not present

## 2021-04-18 DIAGNOSIS — I10 Essential (primary) hypertension: Secondary | ICD-10-CM | POA: Diagnosis not present

## 2021-04-18 DIAGNOSIS — F5101 Primary insomnia: Secondary | ICD-10-CM | POA: Diagnosis not present

## 2021-04-18 DIAGNOSIS — F411 Generalized anxiety disorder: Secondary | ICD-10-CM | POA: Diagnosis not present

## 2021-04-24 DIAGNOSIS — F5104 Psychophysiologic insomnia: Secondary | ICD-10-CM | POA: Diagnosis not present

## 2021-04-24 DIAGNOSIS — F4310 Post-traumatic stress disorder, unspecified: Secondary | ICD-10-CM | POA: Diagnosis not present

## 2021-05-20 DIAGNOSIS — F331 Major depressive disorder, recurrent, moderate: Secondary | ICD-10-CM | POA: Insufficient documentation

## 2021-05-20 DIAGNOSIS — K862 Cyst of pancreas: Secondary | ICD-10-CM | POA: Insufficient documentation

## 2021-05-20 DIAGNOSIS — K863 Pseudocyst of pancreas: Secondary | ICD-10-CM | POA: Insufficient documentation

## 2021-05-20 DIAGNOSIS — E785 Hyperlipidemia, unspecified: Secondary | ICD-10-CM | POA: Insufficient documentation

## 2021-05-23 DIAGNOSIS — F330 Major depressive disorder, recurrent, mild: Secondary | ICD-10-CM | POA: Diagnosis not present

## 2021-05-23 DIAGNOSIS — F419 Anxiety disorder, unspecified: Secondary | ICD-10-CM | POA: Diagnosis not present

## 2021-05-23 DIAGNOSIS — F4310 Post-traumatic stress disorder, unspecified: Secondary | ICD-10-CM | POA: Diagnosis not present

## 2021-05-23 DIAGNOSIS — F5105 Insomnia due to other mental disorder: Secondary | ICD-10-CM | POA: Diagnosis not present

## 2021-05-23 DIAGNOSIS — I10 Essential (primary) hypertension: Secondary | ICD-10-CM | POA: Diagnosis not present

## 2021-11-10 ENCOUNTER — Ambulatory Visit (INDEPENDENT_AMBULATORY_CARE_PROVIDER_SITE_OTHER): Payer: Medicare HMO | Admitting: Family Medicine

## 2021-11-10 ENCOUNTER — Encounter: Payer: Self-pay | Admitting: Family Medicine

## 2021-11-10 VITALS — BP 140/74 | HR 80 | Ht 67.0 in | Wt 162.0 lb

## 2021-11-10 DIAGNOSIS — F419 Anxiety disorder, unspecified: Secondary | ICD-10-CM

## 2021-11-10 DIAGNOSIS — Z1231 Encounter for screening mammogram for malignant neoplasm of breast: Secondary | ICD-10-CM | POA: Diagnosis not present

## 2021-11-10 DIAGNOSIS — I10 Essential (primary) hypertension: Secondary | ICD-10-CM | POA: Diagnosis not present

## 2021-11-10 DIAGNOSIS — G4700 Insomnia, unspecified: Secondary | ICD-10-CM | POA: Diagnosis not present

## 2021-11-10 DIAGNOSIS — F32A Depression, unspecified: Secondary | ICD-10-CM

## 2021-11-10 DIAGNOSIS — J349 Unspecified disorder of nose and nasal sinuses: Secondary | ICD-10-CM

## 2021-11-10 MED ORDER — LISINOPRIL 20 MG PO TABS: 20.0000 mg | ORAL_TABLET | Freq: Every day | ORAL | 1 refills | Status: DC

## 2021-11-10 NOTE — Progress Notes (Signed)
Place inside left nostril ?Needs diagnostic mammo-[facility] has appointment tues ?

## 2021-11-10 NOTE — Progress Notes (Signed)
? ?New Patient Office Visit ? ?Subjective   ? ?Patient ID: Michele Flores, female    DOB: [birth year]  Age: 81 y.o. MRN: [id number] ? ?CC: establish care ? ? ?HPI ?[name] presents to establish care ? ? ?Nose lesion: ?- Reports her nose gets dry in the winter and she has occasional nose bleeds. She could feel a scab in her left nare for awhile that eventually went away and now she sees a white area. It is not painful, not bleeding, "just there." She has an appointment with ENT upcoming. Asymptomatic. ? ? ?HYPERTENSION: ?- Medications: lisinopril 20 mg daily  ?- Compliance: yes ?- Checking BP at home: "always good" ?- Denies any SOB, recurrent headaches, CP, vision changes, LE edema, dizziness, palpitations, or medication side effects. ?- Diet: IBS diet ?- Exercise: 6/7 days per week, walking, youtube workout videos  ?- Stressors: grandson with mental health issues  ? ? ? ?IBS: ?- Patient is following with GI (just saw them in March). She has dicyclomine, probiotics, and simethicone.  ?- Reports problems started after hiatal hernia surgery a few years ago. She has been working on lifestyle modifications - cannot find any trigger foods. Reports symptoms (gas pains, constipation) have been a little better since starting probiotics.  ? ? ? ?ANXIETY & DEPRESSION/INSOMNIA: ?- Reports mild history in the past. Feels like it is situational - son with mental/physical disabilities passed away in 10/17/2018 and care giving for him was very stressful. She is doing much better now. She has not tolerated antidepressants or sleeping aids int he past. No SI/HI. ?- She does use gabapentin 600 mg at bedtime which helps her go to sleep, but she still has trouble staying asleep. She would like to continue to take off-label. She was on xanax in the past which worked great, but she wanted to get off of benzos, so she weaned herself off.  ? ? ? ? ? ? ? ?Outpatient Encounter Medications as of 11/10/2021  ?Medication Sig  ?  Probiotic Product (PROBIOTIC PO) Take by mouth.  ? dicyclomine (BENTYL) 20 MG tablet Take 20 mg by mouth daily as needed.  ? gabapentin (NEURONTIN) 300 MG capsule Take 600 mg by mouth at bedtime.  ? lisinopril (ZESTRIL) 20 MG tablet Take 1 tablet (20 mg total) by mouth daily.  ? simethicone (MYLICON) 125 MG chewable tablet Chew by mouth.  ? [DISCONTINUED] acetaminophen (TYLENOL) 500 MG tablet Take 2 tablets (1,000 mg total) by mouth every 8 (eight) hours as needed for mild pain.  ? [DISCONTINUED] alprazolam (XANAX) 2 MG tablet Take 1 mg by mouth at bedtime.   ? [DISCONTINUED] famotidine (PEPCID) 20 MG tablet Take 20 mg by mouth daily as needed.  ? [DISCONTINUED] hydrocortisone 2.5 % cream Apply 1 application topically 2 (two) times daily.  ? [DISCONTINUED] lisinopril (PRINIVIL,ZESTRIL) 20 MG tablet Take 20 mg by mouth daily.  ? [DISCONTINUED] omeprazole (PRILOSEC) 20 MG capsule Take 20 mg by mouth daily. Time released  ? [DISCONTINUED] oxyCODONE (ROXICODONE) 5 MG/5ML solution Take 5 mLs (5 mg total) by mouth every 6 (six) hours as needed for severe pain.  ? ?No facility-administered encounter medications on file as of 11/10/2021.  ? ? ?Past Medical History:  ?Diagnosis Date  ? Anxiety   ? Depression   ? Hypertension   ? IBS (irritable bowel syndrome)   ? ? ?Past Surgical History:  ?Procedure Laterality Date  ? Colonization of Uterus   1978  ? Checking for Cancer  ?  UPPER GI ENDOSCOPY N/A 02/09/2020  ? Procedure: UPPER GI ENDOSCOPY;  Surgeon: Gaynelle AduWilson, Eric, MD;  Location: [facility];  Service: General;  Laterality: N/A;  ? XI ROBOTIC ASSISTED HIATAL HERNIA REPAIR N/A 02/09/2020  ? Procedure: XI ROBOTIC ASSISTED HIATAL HERNIA REPAIR WITH PARTIAL TOUPETFUNDOPLICATION;  Surgeon: Gaynelle AduWilson, Eric, MD;  Location: [facility];  Service: General;  Laterality: N/A;  ? ? ?Family History  ?Problem Relation Age of Onset  ? Drug abuse Mother   ? Aneurysm Mother   ? Cancer Father   ? Cancer Brother   ? Mental illness Brother   ? Diabetes Son   ?  Early death Son   ? Cancer Son   ? Kidney disease Son   ? Mental illness Son   ? ? ?Social History  ? ?Socioeconomic History  ? Marital status: Married  ?  Spouse name: Not on file  ? Number of children: Not on file  ? Years of education: Not on file  ? Highest education level: Not on file  ?Occupational History  ? Not on file  ?Tobacco Use  ? Smoking status: Former  ?  Packs/day: 0.50  ?  Years: 20.00  ?  Pack years: 10.00  ?  Types: Cigarettes  ?  Quit date: 01/26/1981  ?  Years since quitting: 40.8  ? Smokeless tobacco: Never  ?Vaping Use  ? Vaping Use: Never used  ?Substance and Sexual Activity  ? Alcohol use: Yes  ?  Alcohol/week: 1.0 standard drink  ?  Types: 1 Glasses of wine per week  ?  Comment: wine  ? Drug use: Never  ? Sexual activity: Not Currently  ?Other Topics Concern  ? Not on file  ?Social History Narrative  ? Not on file  ? ?Social Determinants of Health  ? ?Financial Resource Strain: Not on file  ?Food Insecurity: Not on file  ?Transportation Needs: Not on file  ?Physical Activity: Not on file  ?Stress: Not on file  ?Social Connections: Not on file  ?Intimate Partner Violence: Not on file  ? ? ?ROS ?All review of systems negative except what is listed in the HPI ? ?  ? ? ?Objective   ? ?BP 140/74   Pulse 80   Ht 5\' 7"  (1.702 m)   Wt 162 lb (73.5 kg)   BMI 25.37 kg/m?  ? ?Physical Exam ?Vitals reviewed.  ?Constitutional:   ?   Appearance: Normal appearance.  ?HENT:  ?   Head: Normocephalic and atraumatic.  ?   Nose:  ?   Comments: <1 cm white patch to left septum, no other findings  ?Cardiovascular:  ?   Rate and Rhythm: Normal rate and regular rhythm.  ?Pulmonary:  ?   Effort: Pulmonary effort is normal.  ?   Breath sounds: Normal breath sounds.  ?Musculoskeletal:  ?   Right lower leg: No edema.  ?   Left lower leg: No edema.  ?Skin: ?   General: Skin is warm and dry.  ?Neurological:  ?   General: No focal deficit present.  ?   Mental Status: She is alert and oriented to person, place, and  time. Mental status is at baseline.  ?Psychiatric:     ?   Mood and Affect: Mood normal.     ?   Behavior: Behavior normal.     ?   Thought Content: Thought content normal.     ?   Judgment: Judgment normal.  ? ? ? ?  ? ?Assessment &  Plan:  ? ?1. Essential hypertension ?Blood pressure is at goal for age and co-morbidities.  I recommend continuing lisinopril 20 mg daily .   ?- BP goal <130/80 ?- monitor and log blood pressures at home ?- check around the same time each day in a relaxed setting ?- Limit salt to <2000 mg/day ?- Follow DASH eating plan (heart healthy diet) ?- limit alcohol to 2 standard drinks per day for men and 1 per day for women ?- avoid tobacco products ?- get at least 2 hours of regular aerobic exercise weekly ?Patient aware of signs/symptoms requiring further/urgent evaluation. ? ?- lisinopril (ZESTRIL) 20 MG tablet; Take 1 tablet (20 mg total) by mouth daily.  Dispense: 90 tablet; Refill: 1 ? ?2. Insomnia, unspecified type ?Doing well with bedtime gabapentin. Continue sleep hygiene.  ? ?3. Anxiety and depression ?Doing well with lifestyle measures only. No SI/HI. Reports good support group. ? ?4. Encounter for screening mammogram for malignant neoplasm of breast ?- [facility]; Future ? ?5. Nose symptom ?Possibly tissue regrowth following nose bleeds/scabbing. Recommend she keep ENT appointment to verify no concerns.  ? ?Return for routine/CPE at your convenience .  ? ?Clayborne Dana, NP ? ? ?

## 2021-11-10 NOTE — Patient Instructions (Signed)
Thank you for choosing [facility] for your Primary Care needs. I am excited for the opportunity to partner with you to meet your health care goals. It was a pleasure meeting you today! ? ? ? ?Information on diet, exercise, and health maintenance recommendations are listed below. This is information to help you be sure you are on track for optimal health and monitoring.  ? ?Please look over this and let us know if you have any questions or if you have completed any of the health maintenance outside of [place] so that we can be sure your records are up to date.  ?___________________________________________________________ ? ?MyChart:  ?For all urgent or time sensitive needs we ask that you please call the office to avoid delays. Our number is [phone]. ?MyChart is not constantly monitored and due to the large volume of messages a day, replies may take up to 72 business hours. ? ?MyChart Policy: ?MyChart allows for you to see your visit notes, after visit summary, provider recommendations, lab and tests results, make an appointment, request refills, and contact your provider or the office for non-urgent questions or concerns. Providers are seeing patients during normal business hours and do not have built in time to review MyChart messages.  ?We ask that you allow a minimum of 3 business days for responses to MyChart messages. For this reason, please do not send urgent requests through MyChart. Please call the office at [phone]. ?New and ongoing conditions may require a visit. We have virtual and in-person visits available for your convenience.  ?Complex MyChart concerns may require a visit. Your provider may request you schedule a virtual or in-person visit to ensure we are providing the best care possible. ?MyChart messages sent after 11:00 AM on Friday will not be received by the provider until Monday morning.  ?  ?Lab and Test Results: ?You will receive your lab and  test results on MyChart as soon as they are completed and results have been sent by the lab or testing facility. Due to this service, you will receive your results BEFORE your provider.  ?I review lab and test results each morning prior to seeing patients. Some results require collaboration with other providers to ensure you are receiving the most appropriate care. For this reason, we ask that you please allow a minimum of 3-5 business days from the time that ALL results have been received for your provider to receive and review lab and test results and contact you about these.  ?Most lab and test result comments from the provider will be sent through MyChart. Your provider may recommend changes to the plan of care, follow-up visits, repeat testing, ask questions, or request an office visit to discuss these results. You may reply directly to this message or call the office to provide information for the provider or set up an appointment. ?In some instances, you will be called with test results and recommendations. Please let us know if this is preferred and we will make note of this in your chart to provide this for you.    ?If you have not heard a response to your lab or test results in 5 business days from all results returning to MyChart, please call the office to let us know. We ask that you please avoid calling prior to this time unless there is an emergent concern. Due to high call volumes, this can delay the resulting process. ? ?After Hours: ?For all non-emergency after hours   needs, please call the office at [phone] and select the option to reach the on-call  service. On-call services are shared between multiple [place] offices and therefore it will not be possible to speak directly with your provider. On-call providers may provide medical advice and recommendations, but are unable to provide refills for maintenance medications.  ?For all emergency or urgent medical needs after normal business  hours, we recommend that you seek care at the closest Urgent Care or Emergency Department to ensure appropriate treatment in a timely manner.  ?[facility] has a 24 hour emergency room located on the ground floor for your convenience.  ? ?Urgent Concerns During the Business Day ?Providers are seeing patients from 8AM to 5PM with a busy schedule and are most often not able to respond to non-urgent calls until the end of the day or the next business day. ?If you should have URGENT concerns during the day, please call and speak to the nurse or schedule a same day appointment so that we can address your concern without delay.  ? ?Thank you, again, for choosing me as your health care partner. I appreciate your trust and look forward to learning more about you.  ? ?Carrell Palmatier B. Janeth Terry, DNP, FNP-C ? ?___________________________________________________________ ? ?Health Maintenance Recommendations ?Screening Testing ?Mammogram ?Every 1-2 years based on history and risk factors ?Starting at age 50 ?Pap Smear ?Ages 21-39 every 3 years ?Ages 30-65 every 5 years with HPV testing ?More frequent testing may be required based on results and history ?Colon Cancer Screening ?Every 1-10 years based on test performed, risk factors, and history ?Starting at age 45 ?Bone Density Screening ?Every 2-10 years based on history ?Starting at age 65 for women ?Recommendations for men differ based on medication usage, history, and risk factors ?AAA Screening ?One time ultrasound ?Men 65-75 years old who have ever smoked ?Lung Cancer Screening ?Low Dose Lung CT every 12 months ?Age 50-80 years with a 20 pack-year smoking history who still smoke or who have quit within the last 15 years ? ?Screening Labs ?Routine  Labs: Complete Blood Count (CBC), Complete Metabolic Panel (CMP), Cholesterol (Lipid Panel) ?Every 6-12 months based on history and medications ?May be recommended more frequently based on current conditions or  previous results ?Hemoglobin A1c Lab ?Every 3-12 months based on history and previous results ?Starting at age 45 or earlier with diagnosis of diabetes, high cholesterol, BMI >26, and/or risk factors ?Frequent monitoring for patients with diabetes to ensure blood sugar control ?Thyroid Panel (TSH w/ T3 & T4) ?Every 6 months based on history, symptoms, and risk factors ?May be repeated more often if on medication ?HIV ?One time testing for all patients 13 and older ?May be repeated more frequently for patients with increased risk factors or exposure ?Hepatitis C ?One time testing for all patients 18 and older ?May be repeated more frequently for patients with increased risk factors or exposure ?Gonorrhea, Chlamydia ?Every 12 months for all sexually active persons 13-24 years ?Additional monitoring may be recommended for those who are considered high risk or who have symptoms ?PSA ?Men 40-54 years old with risk factors ?Additional screening may be recommended from age 55-69 based on risk factors, symptoms, and history ? ?Vaccine Recommendations ?Tetanus Booster ?All adults every 10 years ?Flu Vaccine ?All patients 6 months and older every year ?COVID Vaccine ?All patients 12 years and older ?Initial dosing with booster ?May recommend additional booster based on age and health history ?HPV Vaccine ?2 doses all   patients age 9-26 ?Dosing may be considered for patients over 26 ?Shingles Vaccine (Shingrix) ?2 doses all adults 50 years and older ?Pneumonia (Pneumovax 23) ?All adults 65 years and older ?May recommend earlier dosing based on health history ?Pneumonia (Prevnar 13) ?All adults 65 years and older ?Dosed 1 year after Pneumovax 23 ?Pneumonia (Prevnar 20) ?All adults 65 years and older (adults 19-64 with certain conditions or risk factors) ?1 dose  ?For those who have no received Prevnar 13 vaccine previously ? ? ?Additional Screening, Testing, and Vaccinations may be recommended on an individualized basis based on  family history, health history, risk factors, and/or exposure.  ?__________________________________________________________ ? ?Diet Recommendations for All Patients ? ?I recommend that all patients maintain a di

## 2021-11-14 ENCOUNTER — Encounter: Payer: Self-pay | Admitting: Family Medicine

## 2021-11-14 LAB — HM MAMMOGRAPHY

## 2021-11-16 ENCOUNTER — Encounter: Payer: Self-pay | Admitting: Family Medicine

## 2021-12-08 ENCOUNTER — Encounter: Payer: Self-pay | Admitting: *Deleted

## 2022-01-05 ENCOUNTER — Encounter: Payer: Self-pay | Admitting: Family Medicine

## 2022-01-05 ENCOUNTER — Ambulatory Visit (INDEPENDENT_AMBULATORY_CARE_PROVIDER_SITE_OTHER): Payer: Medicare HMO | Admitting: Family Medicine

## 2022-01-05 VITALS — BP 138/70 | HR 86 | Ht 67.0 in | Wt 158.2 lb

## 2022-01-05 DIAGNOSIS — Z0000 Encounter for general adult medical examination without abnormal findings: Secondary | ICD-10-CM | POA: Diagnosis not present

## 2022-01-05 DIAGNOSIS — I10 Essential (primary) hypertension: Secondary | ICD-10-CM

## 2022-01-05 DIAGNOSIS — F419 Anxiety disorder, unspecified: Secondary | ICD-10-CM | POA: Diagnosis not present

## 2022-01-05 DIAGNOSIS — F32A Depression, unspecified: Secondary | ICD-10-CM | POA: Insufficient documentation

## 2022-01-05 LAB — CBC
HCT: 46.9 % — ABNORMAL HIGH (ref 36.0–46.0)
Hemoglobin: 15.6 g/dL — ABNORMAL HIGH (ref 12.0–15.0)
MCHC: 33.3 g/dL (ref 30.0–36.0)
MCV: 95.8 fl (ref 78.0–100.0)
Platelets: 253 10*3/uL (ref 150.0–400.0)
RBC: 4.9 Mil/uL (ref 3.87–5.11)
RDW: 13.3 % (ref 11.5–15.5)
WBC: 6.4 10*3/uL (ref 4.0–10.5)

## 2022-01-05 LAB — COMPREHENSIVE METABOLIC PANEL
ALT: 9 U/L (ref 0–35)
AST: 14 U/L (ref 0–37)
Albumin: 4.6 g/dL (ref 3.5–5.2)
Alkaline Phosphatase: 84 U/L (ref 39–117)
BUN: 14 mg/dL (ref 6–23)
CO2: 27 mEq/L (ref 19–32)
Calcium: 9.6 mg/dL (ref 8.4–10.5)
Chloride: 101 mEq/L (ref 96–112)
Creatinine, Ser: 0.85 mg/dL (ref 0.40–1.20)
GFR: 64.86 mL/min (ref >60.00)
Glucose, Bld: 95 mg/dL (ref 70–99)
Potassium: 4.6 mEq/L (ref 3.5–5.1)
Sodium: 138 mEq/L (ref 135–145)
Total Bilirubin: 0.7 mg/dL (ref 0.2–1.2)
Total Protein: 6.9 g/dL (ref 6.0–8.3)

## 2022-01-05 LAB — LIPID PANEL
Cholesterol: 176 mg/dL (ref 0–200)
HDL: 70.1 mg/dL (ref >39.00)
LDL Cholesterol: 93 mg/dL (ref 0–99)
NonHDL: 105.57
Total CHOL/HDL Ratio: 3
Triglycerides: 65 mg/dL (ref 0.0–149.0)
VLDL: 13 mg/dL (ref 0.0–40.0)

## 2022-01-05 LAB — TSH: TSH: 0.38 u[IU]/mL (ref 0.35–5.50)

## 2022-01-05 MED ORDER — GABAPENTIN 300 MG PO CAPS: 600.0000 mg | ORAL_CAPSULE | Freq: Every day | ORAL | 1 refills | Status: DC

## 2022-01-05 NOTE — Progress Notes (Signed)
Complete physical exam  Patient: Michele Flores   DOB: Nov 25, 1940   81 y.o. Female  MRN: [id number]  Subjective:    CC: CPE   Michele Flores is a 81 y.o. female who presents today for a complete physical exam. She reports consuming a general diet. Home exercise routine includes walking (6k steps per day), youtube workout videos. She generally feels well. She reports sleeping fairly well. She does not have additional problems to discuss today.    Most recent fall risk assessment:    01/05/2022    8:57 AM  Fall Risk   Falls in the past year? 0  Number falls in past yr: 0  Injury with Fall? 0  Risk for fall due to : No Fall Risks  Follow up Falls evaluation completed     Most recent depression screenings:    01/05/2022    8:57 AM 11/10/2021    9:43 AM  PHQ 2/9 Scores  PHQ - 2 Score 0 0    Vision:Within last year and Dental: No current dental problems and Receives regular dental care    Patient Care Team: Clayborne Dana, NP as PCP - General (Family Medicine)   Outpatient Medications Prior to Visit  Medication Sig   dicyclomine (BENTYL) 20 MG tablet Take 20 mg by mouth daily as needed.   lisinopril (ZESTRIL) 20 MG tablet Take 1 tablet (20 mg total) by mouth daily.   Probiotic Product (PROBIOTIC PO) Take by mouth.   simethicone (MYLICON) 125 MG chewable tablet Chew by mouth.   [DISCONTINUED] gabapentin (NEURONTIN) 300 MG capsule Take 600 mg by mouth at bedtime.   No facility-administered medications prior to visit.    ROS All review of systems negative except what is listed in the HPI        Objective:     BP 138/70   Pulse 86   Ht 5\' 7"  (1.702 m)   Wt 158 lb 3.2 oz (71.8 kg)   BMI 24.78 kg/m    Physical Exam Vitals reviewed.  Constitutional:      General: She is not in acute distress.    Appearance: Normal appearance. She is normal weight. She is not ill-appearing.  HENT:     Head: Normocephalic and atraumatic.     Right Ear:  Tympanic membrane normal.     Left Ear: Tympanic membrane normal.     Nose: Nose normal.     Mouth/Throat:     Mouth: Mucous membranes are moist.     Pharynx: Oropharynx is clear.  Eyes:     Extraocular Movements: Extraocular movements intact.     Conjunctiva/sclera: Conjunctivae normal.     Pupils: Pupils are equal, round, and reactive to light.  Neck:     Vascular: No carotid bruit.  Cardiovascular:     Rate and Rhythm: Normal rate and regular rhythm.  Pulmonary:     Effort: Pulmonary effort is normal.     Breath sounds: Normal breath sounds.  Abdominal:     General: Abdomen is flat. Bowel sounds are normal. There is no distension.     Palpations: Abdomen is soft. There is no mass.     Tenderness: There is no abdominal tenderness. There is no guarding or rebound.  Genitourinary:    Comments: Exam deferred Musculoskeletal:        General: Normal range of motion.     Cervical back: Normal range of motion and neck supple. No rigidity or tenderness.  Lymphadenopathy:  Cervical: No cervical adenopathy.  Skin:    General: Skin is warm and dry.     Capillary Refill: Capillary refill takes less than 2 seconds.  Neurological:     General: No focal deficit present.     Mental Status: She is alert and oriented to person, place, and time. Mental status is at baseline.  Psychiatric:        Mood and Affect: Mood normal.        Behavior: Behavior normal.        Thought Content: Thought content normal.        Judgment: Judgment normal.      No results found for any visits on 01/05/22.     Assessment & Plan:    Routine Health Maintenance and Physical Exam  Immunization History  Administered Date(s) Administered   Influenza, High Dose Seasonal PF 03/25/2021   PFIZER(Purple Top)SARS-COV-2 Vaccination 07/12/2019, 08/01/2019   Pneumococcal-Unspecified 07/02/2013   Zoster Recombinat (Shingrix) 01/07/2019, 04/08/2019    Health Maintenance  Topic Date Due   TETANUS/TDAP  Never  done   Pneumonia Vaccine 19+ Years old (1 - PCV) 12/05/2006   DEXA SCAN  Never done   COVID-19 Vaccine (4 - Booster for Pfizer series) 05/04/2021   INFLUENZA VACCINE  01/30/2022   Zoster Vaccines- Shingrix  Completed   HPV VACCINES  Aged Out    Discussed health benefits of physical activity, and encouraged her to engage in regular exercise appropriate for her age and condition.  Problem List Items Addressed This Visit       Cardiovascular and Mediastinum   Essential hypertension Stable. Home readings have been good as well. Continue lisinopril and lifestyle measures. No changes today. Updating labs.    Relevant Orders   CBC   Comprehensive metabolic panel   Lipid panel   TSH     Other   Anxiety and depression - Primary No SI/HI. States she has been taking gabapentin off label for years for mood and sleep and it is the only thing that helps. No side effects. Discussed safety.    Relevant Medications   gabapentin (NEURONTIN) 300 MG capsule   Other Visit Diagnoses     Annual physical exam     Doing well overall. No new concerns. Discussed health promotion and safety.    Relevant Medications   gabapentin (NEURONTIN) 300 MG capsule   Other Relevant Orders   CBC   Comprehensive metabolic panel   Lipid panel   TSH      Return in about 1 year (around 01/06/2023) for physical .     Clayborne Dana, NP

## 2022-05-02 ENCOUNTER — Ambulatory Visit (INDEPENDENT_AMBULATORY_CARE_PROVIDER_SITE_OTHER): Payer: Medicare HMO | Admitting: *Deleted

## 2022-05-02 DIAGNOSIS — Z0000 Encounter for general adult medical examination without abnormal findings: Secondary | ICD-10-CM

## 2022-05-02 NOTE — Progress Notes (Signed)
Subjective:   Michele Flores is a 81 y.o. female who presents for Medicare Annual (Subsequent) preventive examination.  I connected with  [name] on 05/02/22 by a audio enabled telemedicine application and verified that I am speaking with the correct person using two identifiers.  Patient Location: Home  Provider Location: Office/Clinic  I discussed the limitations of evaluation and management by telemedicine. The patient expressed understanding and agreed to proceed.   Review of Systems    Defer to PCP Cardiac Risk Factors include: hypertension;advanced age (>53men, >22 women)     Objective:    There were no vitals filed for this visit. There is no height or weight on file to calculate BMI.     05/02/2022    9:46 AM 02/09/2020   11:48 AM 02/04/2020    9:10 AM  Advanced Directives  Does Patient Have a Medical Advance Directive? Yes Yes Yes  Type of Estate agent of [place];Living will Healthcare Power of [place];Living will Living will;Healthcare Power of Attorney  Does patient want to make changes to medical advance directive? No - Patient declined No - Guardian declined   Copy of Healthcare Power of Attorney in Chart? No - copy requested  No - copy requested    Current Medications (verified) Outpatient Encounter Medications as of 05/02/2022  Medication Sig   dicyclomine (BENTYL) 20 MG tablet Take 20 mg by mouth daily as needed.   gabapentin (NEURONTIN) 300 MG capsule Take 2 capsules (600 mg total) by mouth at bedtime.   lisinopril (ZESTRIL) 20 MG tablet Take 1 tablet (20 mg total) by mouth daily.   Probiotic Product (PROBIOTIC PO) Take by mouth.   simethicone (MYLICON) 125 MG chewable tablet Chew by mouth.   No facility-administered encounter medications on file as of 05/02/2022.    Allergies (verified) Codeine and Sleeping [diphenhydramine]   History: Past Medical History:  Diagnosis Date   Anxiety    Depression     Hypertension    IBS (irritable bowel syndrome)    Past Surgical History:  Procedure Laterality Date   Colonization of Uterus   1978   Checking for Cancer   UPPER GI ENDOSCOPY N/A 02/09/2020   Procedure: UPPER GI ENDOSCOPY;  Surgeon: Gaynelle Adu, MD;  Location: [facility];  Service: General;  Laterality: N/A;   XI ROBOTIC ASSISTED HIATAL HERNIA REPAIR N/A 02/09/2020   Procedure: XI ROBOTIC ASSISTED HIATAL HERNIA REPAIR WITH PARTIAL TOUPETFUNDOPLICATION;  Surgeon: Gaynelle Adu, MD;  Location: [facility];  Service: General;  Laterality: N/A;   Family History  Problem Relation Age of Onset   Drug abuse Mother    Aneurysm Mother    Cancer Father    Cancer Brother    Mental illness Brother    Diabetes Son    Early death Son    Cancer Son    Kidney disease Son    Mental illness Son    Social History   Socioeconomic History   Marital status: Married    Spouse name: Not on file   Number of children: Not on file   Years of education: Not on file   Highest education level: Not on file  Occupational History   Not on file  Tobacco Use   Smoking status: Former    Packs/day: 0.50    Years: 20.00    Total pack years: 10.00    Types: Cigarettes    Quit date: 01/26/1981    Years since quitting: 41.2   Smokeless tobacco:  Never  Vaping Use   Vaping Use: Never used  Substance and Sexual Activity   Alcohol use: Yes    Alcohol/week: 1.0 standard drink of alcohol    Types: 1 Glasses of wine per week    Comment: wine   Drug use: Never   Sexual activity: Not Currently  Other Topics Concern   Not on file  Social History Narrative   Not on file   Social Determinants of Health   Financial Resource Strain: Low Risk  (05/02/2022)   Overall Financial Resource Strain (CARDIA)    Difficulty of Paying Living Expenses: Not hard at all  Food Insecurity: No Food Insecurity (05/02/2022)   Hunger Vital Sign    Worried About Running Out of Food in the Last Year: Never true    Ran Out of Food in the  Last Year: Never true  Transportation Needs: No Transportation Needs (05/02/2022)   PRAPARE - Administrator, Civil Service (Medical): No    Lack of Transportation (Non-Medical): No  Physical Activity: Sufficiently Active (05/02/2022)   Exercise Vital Sign    Days of Exercise per Week: 6 days    Minutes of Exercise per Session: 60 min  Stress: Stress Concern Present (05/02/2022)   [employer] of Occupational Health - Occupational Stress Questionnaire    Feeling of Stress : To some extent  Social Connections: Socially Isolated (05/02/2022)   Social Connection and Isolation Panel [NHANES]    Frequency of Communication with Friends and Family: Once a week    Frequency of Social Gatherings with Friends and Family: Never    Attends Religious Services: Never    Diplomatic Services operational officer: No    Attends Engineer, structural: Never    Marital Status: Married    Tobacco Counseling Counseling given: Not Answered   Clinical Intake:  Pre-visit preparation completed: Yes  Pain : No/denies pain  Diabetes: No  How often do you need to have someone help you when you read instructions, pamphlets, or other written materials from your doctor or pharmacy?: 1 - Never  Activities of Daily Living    05/02/2022    9:47 AM 05/01/2022    7:27 AM  In your present state of health, do you have any difficulty performing the following activities:  Hearing? 1 1  Comment wears hearing aids   Vision? 0 0  Difficulty concentrating or making decisions? 0 0  Walking or climbing stairs? 0 0  Dressing or bathing? 0 0  Doing errands, shopping? 0 0  Preparing Food and eating ? N N  Using the Toilet? N N  In the past six months, have you accidently leaked urine? Malvin Johns  Comment wears pads   Do you have problems with loss of bowel control? N N  Managing your Medications? N N  Managing your Finances? N N  Housekeeping or managing your Housekeeping? N N    Patient Care  Team: Clayborne Dana, NP as PCP - General (Family Medicine)  Indicate any recent Medical Services you may have received from other than [facility] providers in the past year (date may be approximate).     Assessment:   This is a routine wellness examination for [place].  Hearing/Vision screen No results found.  Dietary issues and exercise activities discussed: Current Exercise Habits: Home exercise routine, Type of exercise: strength training/weights;stretching;walking;yoga, Time (Minutes): 60, Frequency (Times/Week): 6, Weekly Exercise (Minutes/Week): 360, Intensity: Moderate, Exercise limited by: None identified   Goals Addressed  None    Depression Screen    05/02/2022    9:47 AM 01/05/2022    8:57 AM 11/10/2021    9:43 AM  PHQ 2/9 Scores  PHQ - 2 Score 0 0 0    Fall Risk    05/02/2022    9:46 AM 05/01/2022    7:27 AM 01/05/2022    8:57 AM  Fall Risk   Falls in the past year? 0 0 0  Number falls in past yr: 0 0 0  Injury with Fall? 0 0 0  Risk for fall due to : No Fall Risks  No Fall Risks  Follow up Falls evaluation completed  Falls evaluation completed    FALL RISK PREVENTION PERTAINING TO THE HOME:  Any stairs in or around the home? Yes  If so, are there any without handrails? No  Home free of loose throw rugs in walkways, pet beds, electrical cords, etc? Yes  Adequate lighting in your home to reduce risk of falls? Yes   ASSISTIVE DEVICES UTILIZED TO PREVENT FALLS:  Life alert? No  Use of a cane, walker or w/c? No  Grab bars in the bathroom? Yes  Shower chair or bench in shower? Yes  Elevated toilet seat or a handicapped toilet? Yes   TIMED UP AND GO:  Was the test performed?  No, audio visit .   Cognitive Function:        05/02/2022    9:50 AM  6CIT Screen  What Year? 0 points  What month? 0 points  What time? 0 points  Count back from 20 0 points  Months in reverse 0 points  Repeat phrase 0 points  Total Score 0 points     Immunizations Immunization History  Administered Date(s) Administered   Influenza, High Dose Seasonal PF 03/25/2021   PFIZER(Purple Top)SARS-COV-2 Vaccination 07/12/2019, 08/01/2019   Pneumococcal-Unspecified 07/02/2013   Zoster Recombinat (Shingrix) 01/07/2019, 04/08/2019    TDAP status: Due, Education has been provided regarding the importance of this vaccine. Advised may receive this vaccine at local pharmacy or Health Dept. Aware to provide a copy of the vaccination record if obtained from local pharmacy or Health Dept. Verbalized acceptance and understanding.  Flu Vaccine status: Due, Education has been provided regarding the importance of this vaccine. Advised may receive this vaccine at local pharmacy or Health Dept. Aware to provide a copy of the vaccination record if obtained from local pharmacy or Health Dept. Verbalized acceptance and understanding.  Pneumococcal vaccine status: Due, Education has been provided regarding the importance of this vaccine. Advised may receive this vaccine at local pharmacy or Health Dept. Aware to provide a copy of the vaccination record if obtained from local pharmacy or Health Dept. Verbalized acceptance and understanding.  Covid-19 vaccine status: Information provided on how to obtain vaccines.   Qualifies for Shingles Vaccine? Yes   Zostavax completed No   Shingrix Completed?: Yes  Screening Tests Health Maintenance  Topic Date Due   Medicare Annual Wellness (AWV)  Never done   TETANUS/TDAP  Never done   Pneumonia Vaccine 80+ Years old (1 - PCV) 12/05/2006   DEXA SCAN  Never done   COVID-19 Vaccine (4 - Pfizer risk series) 05/04/2021   INFLUENZA VACCINE  01/30/2022   Zoster Vaccines- Shingrix  Completed   HPV VACCINES  Aged Out    Health Maintenance  Health Maintenance Due  Topic Date Due   Medicare Annual Wellness (AWV)  Never done   TETANUS/TDAP  Never done  Pneumonia Vaccine 105+ Years old (1 - PCV) 12/05/2006   DEXA SCAN   Never done   COVID-19 Vaccine (4 - Pfizer risk series) 05/04/2021   INFLUENZA VACCINE  01/30/2022    Colorectal cancer screening: No longer required.   Mammogram status: Completed 11/14/21. Repeat every year  Bone Density status: pt declines  Lung Cancer Screening: (Low Dose CT Chest recommended if Age 80-80 years, 30 pack-year currently smoking OR have quit w/in 15years.) does not qualify.   Lung Cancer Screening Referral: N/a  Additional Screening:  Hepatitis C Screening: does not qualify;   Vision Screening: Recommended annual ophthalmology exams for early detection of glaucoma and other disorders of the eye. Is the patient up to date with their annual eye exam?  Yes  Who is the provider or what is the name of the office in which the patient attends annual eye exams? [facility]. If pt is not established with a provider, would they like to be referred to a provider to establish care? No .   Dental Screening: Recommended annual dental exams for proper oral hygiene  Community Resource Referral / Chronic Care Management: CRR required this visit?  No   CCM required this visit?  No      Plan:     I have personally reviewed and noted the following in the patient's chart:   Medical and social history Use of alcohol, tobacco or illicit drugs  Current medications and supplements including opioid prescriptions. Patient is not currently taking opioid prescriptions. Functional ability and status Nutritional status Physical activity Advanced directives List of other physicians Hospitalizations, surgeries, and ER visits in previous 12 months Vitals Screenings to include cognitive, depression, and falls Referrals and appointments  In addition, I have reviewed and discussed with patient certain preventive protocols, quality metrics, and best practice recommendations. A written personalized care plan for preventive services as well as general preventive health recommendations were  provided to patient.   Due to this being a telephonic visit, the after visit summary with patients personalized plan was offered to patient via mail or my-chart.  Patient would like to access on my-chart.  Beatris Ship, [place]   05/02/2022   Nurse Notes: None

## 2022-05-02 NOTE — Patient Instructions (Signed)
[name] , Thank you for taking time to come for your Medicare Wellness Visit. I appreciate your ongoing commitment to your health goals. Please review the following plan we discussed and let me know if I can assist you in the future.   These are the goals we discussed:  Goals   None     This is a list of the screening recommended for you and due dates:  Health Maintenance  Topic Date Due   Tetanus Vaccine  Never done   Pneumonia Vaccine (1 - PCV) 12/05/2006   DEXA scan (bone density measurement)  Never done   COVID-19 Vaccine (4 - Pfizer risk series) 05/04/2021   Flu Shot  01/30/2022   Medicare Annual Wellness Visit  05/03/2023   Zoster (Shingles) Vaccine  Completed   HPV Vaccine  Aged Out     Next appointment: Follow up in one year for your annual wellness visit.   Preventive Care 35 Years and Older, Female Preventive care refers to lifestyle choices and visits with your health care provider that can promote health and wellness. What does preventive care include? A yearly physical exam. This is also called an annual well check. Dental exams once or twice a year. Routine eye exams. Ask your health care provider how often you should have your eyes checked. Personal lifestyle choices, including: Daily care of your teeth and gums. Regular physical activity. Eating a healthy diet. Avoiding tobacco and drug use. Limiting alcohol use. Practicing safe sex. Taking low-dose aspirin every day. Taking vitamin and mineral supplements as recommended by your health care provider. What happens during an annual well check? The services and screenings done by your health care provider during your annual well check will depend on your age, overall health, lifestyle risk factors, and family history of disease. Counseling  Your health care provider may ask you questions about your: Alcohol use. Tobacco use. Drug use. Emotional well-being. Home and relationship well-being. Sexual  activity. Eating habits. History of falls. Memory and ability to understand (cognition). Work and work Statistician. Reproductive health. Screening  You may have the following tests or measurements: Height, weight, and BMI. Blood pressure. Lipid and cholesterol levels. These may be checked every 5 years, or more frequently if you are over 35 years old. Skin check. Lung cancer screening. You may have this screening every year starting at age 23 if you have a 30-pack-year history of smoking and currently smoke or have quit within the past 15 years. Fecal occult blood test (FOBT) of the stool. You may have this test every year starting at age 83. Flexible sigmoidoscopy or colonoscopy. You may have a sigmoidoscopy every 5 years or a colonoscopy every 10 years starting at age 57. Hepatitis C blood test. Hepatitis B blood test. Sexually transmitted disease (STD) testing. Diabetes screening. This is done by checking your blood sugar (glucose) after you have not eaten for a while (fasting). You may have this done every 1-3 years. Bone density scan. This is done to screen for osteoporosis. You may have this done starting at age 20. Mammogram. This may be done every 1-2 years. Talk to your health care provider about how often you should have regular mammograms. Talk with your health care provider about your test results, treatment options, and if necessary, the need for more tests. Vaccines  Your health care provider may recommend certain vaccines, such as: Influenza vaccine. This is recommended every year. Tetanus, diphtheria, and acellular pertussis (Tdap, Td) vaccine. You may need a  Td booster every 10 years. Zoster vaccine. You may need this after age 50. Pneumococcal 13-valent conjugate (PCV13) vaccine. One dose is recommended after age 56. Pneumococcal polysaccharide (PPSV23) vaccine. One dose is recommended after age 68. Talk to your health care provider about which screenings and vaccines  you need and how often you need them. This information is not intended to replace advice given to you by your health care provider. Make sure you discuss any questions you have with your health care provider. Document Released: 07/15/2015 Document Revised: 03/07/2016 Document Reviewed: 04/19/2015 Elsevier Interactive Patient Education  2017 [place] Prevention in the Home Falls can cause injuries. They can happen to people of all ages. There are many things you can do to make your home safe and to help prevent falls. What can I do on the outside of my home? Regularly fix the edges of walkways and driveways and fix any cracks. Remove anything that might make you trip as you walk through a door, such as a raised step or threshold. Trim any bushes or trees on the path to your home. Use bright outdoor lighting. Clear any walking paths of anything that might make someone trip, such as rocks or tools. Regularly check to see if handrails are loose or broken. Make sure that both sides of any steps have handrails. Any raised decks and porches should have guardrails on the edges. Have any leaves, snow, or ice cleared regularly. Use sand or salt on walking paths during winter. Clean up any spills in your garage right away. This includes oil or grease spills. What can I do in the bathroom? Use night lights. Install grab bars by the toilet and in the tub and shower. Do not use towel bars as grab bars. Use non-skid mats or decals in the tub or shower. If you need to sit down in the shower, use a plastic, non-slip stool. Keep the floor dry. Clean up any water that spills on the floor as soon as it happens. Remove soap buildup in the tub or shower regularly. Attach bath mats securely with double-sided non-slip rug tape. Do not have throw rugs and other things on the floor that can make you trip. What can I do in the bedroom? Use night lights. Make sure that you have a light by your bed that  is easy to reach. Do not use any sheets or blankets that are too big for your bed. They should not hang down onto the floor. Have a firm chair that has side arms. You can use this for support while you get dressed. Do not have throw rugs and other things on the floor that can make you trip. What can I do in the kitchen? Clean up any spills right away. Avoid walking on wet floors. Keep items that you use a lot in easy-to-reach places. If you need to reach something above you, use a strong step stool that has a grab bar. Keep electrical cords out of the way. Do not use floor polish or wax that makes floors slippery. If you must use wax, use non-skid floor wax. Do not have throw rugs and other things on the floor that can make you trip. What can I do with my stairs? Do not leave any items on the stairs. Make sure that there are handrails on both sides of the stairs and use them. Fix handrails that are broken or loose. Make sure that handrails are as long as the stairways. Check any  carpeting to make sure that it is firmly attached to the stairs. Fix any carpet that is loose or worn. Avoid having throw rugs at the top or bottom of the stairs. If you do have throw rugs, attach them to the floor with carpet tape. Make sure that you have a light switch at the top of the stairs and the bottom of the stairs. If you do not have them, ask someone to add them for you. What else can I do to help prevent falls? Wear shoes that: Do not have high heels. Have rubber bottoms. Are comfortable and fit you well. Are closed at the toe. Do not wear sandals. If you use a stepladder: Make sure that it is fully opened. Do not climb a closed stepladder. Make sure that both sides of the stepladder are locked into place. Ask someone to hold it for you, if possible. Clearly mark and make sure that you can see: Any grab bars or handrails. First and last steps. Where the edge of each step is. Use tools that help you  move around (mobility aids) if they are needed. These include: Canes. Walkers. Scooters. Crutches. Turn on the lights when you go into a dark area. Replace any light bulbs as soon as they burn out. Set up your furniture so you have a clear path. Avoid moving your furniture around. If any of your floors are uneven, fix them. If there are any pets around you, be aware of where they are. Review your medicines with your doctor. Some medicines can make you feel dizzy. This can increase your chance of falling. Ask your doctor what other things that you can do to help prevent falls. This information is not intended to replace advice given to you by your health care provider. Make sure you discuss any questions you have with your health care provider. Document Released: 04/14/2009 Document Revised: 11/24/2015 Document Reviewed: 07/23/2014 Elsevier Interactive Patient Education  2017 [employer].

## 2022-05-12 ENCOUNTER — Encounter: Payer: Self-pay | Admitting: Family Medicine

## 2022-06-18 ENCOUNTER — Telehealth (INDEPENDENT_AMBULATORY_CARE_PROVIDER_SITE_OTHER): Payer: Medicare HMO | Admitting: Family Medicine

## 2022-06-18 ENCOUNTER — Encounter: Payer: Self-pay | Admitting: Family Medicine

## 2022-06-18 DIAGNOSIS — I10 Essential (primary) hypertension: Secondary | ICD-10-CM | POA: Diagnosis not present

## 2022-06-18 DIAGNOSIS — U071 COVID-19: Secondary | ICD-10-CM | POA: Insufficient documentation

## 2022-06-18 MED ORDER — NIRMATRELVIR/RITONAVIR (PAXLOVID)TABLET
3.0000 | ORAL_TABLET | Freq: Two times a day (BID) | ORAL | 0 refills | Status: AC
Start: 2022-06-18 — End: 2022-06-23

## 2022-06-18 NOTE — Assessment & Plan Note (Signed)
Monitor and report any concerns 

## 2022-06-18 NOTE — Progress Notes (Signed)
MyChart Video Visit    Virtual Visit via Video Note   This visit type was conducted due to national recommendations for restrictions regarding the COVID-19 Pandemic (e.g. social distancing) in an effort to limit this patient's exposure and mitigate transmission in our community. This patient is at least at moderate risk for complications without adequate follow up. This format is felt to be most appropriate for this patient at this time. Physical exam was limited by quality of the video and audio technology used for the visit. Shamaine, CMA was able to get the patient set up on a video visit.  Patient location: home Patient and provider in visit Provider location: Office  I discussed the limitations of evaluation and management by telemedicine and the availability of in person appointments. The patient expressed understanding and agreed to proceed.  Visit Date: 06/18/2022  Today's healthcare provider: Danise Edge, MD     Subjective:    Patient ID: Michele Flores, female    DOB: [birth year], 81 y.o.   MRN: [id number]  Chief Complaint  Patient presents with   Covid Positive    Covid Positive     HPI Patient is in today for evaluation of COVID. Symptoms started last night with headache, low grade temp 100.3 with Advil, fatigue, anorexia, mild congestion and dry cough (mild), tested positive for COVID at home this morning. Denies CP/palp/SOB/GI or GU c/o. Taking meds as prescribed   Past Medical History:  Diagnosis Date   Anxiety    Depression    Hypertension    IBS (irritable bowel syndrome)     Past Surgical History:  Procedure Laterality Date   Colonization of Uterus   1978   Checking for Cancer   UPPER GI ENDOSCOPY N/A 02/09/2020   Procedure: UPPER GI ENDOSCOPY;  Surgeon: Gaynelle Adu, MD;  Location: [facility];  Service: General;  Laterality: N/A;   XI ROBOTIC ASSISTED HIATAL HERNIA REPAIR N/A 02/09/2020   Procedure: XI ROBOTIC ASSISTED HIATAL HERNIA REPAIR WITH  PARTIAL TOUPETFUNDOPLICATION;  Surgeon: Gaynelle Adu, MD;  Location: [facility];  Service: General;  Laterality: N/A;    Family History  Problem Relation Age of Onset   Drug abuse Mother    Aneurysm Mother    Cancer Father    Cancer Brother    Mental illness Brother    Diabetes Son    Early death Son    Cancer Son    Kidney disease Son    Mental illness Son     Social History   Socioeconomic History   Marital status: Married    Spouse name: Not on file   Number of children: Not on file   Years of education: Not on file   Highest education level: Not on file  Occupational History   Not on file  Tobacco Use   Smoking status: Former    Packs/day: 0.50    Years: 20.00    Total pack years: 10.00    Types: Cigarettes    Quit date: 01/26/1981    Years since quitting: 41.4   Smokeless tobacco: Never  Vaping Use   Vaping Use: Never used  Substance and Sexual Activity   Alcohol use: Yes    Alcohol/week: 1.0 standard drink of alcohol    Types: 1 Glasses of wine per week    Comment: wine   Drug use: Never   Sexual activity: Not Currently  Other Topics Concern   Not on file  Social History Narrative   Not on  file   Social Determinants of Health   Financial Resource Strain: Low Risk  (05/02/2022)   Overall Financial Resource Strain (CARDIA)    Difficulty of Paying Living Expenses: Not hard at all  Food Insecurity: No Food Insecurity (05/02/2022)   Hunger Vital Sign    Worried About Running Out of Food in the Last Year: Never true    Ran Out of Food in the Last Year: Never true  Transportation Needs: No Transportation Needs (05/02/2022)   PRAPARE - Administrator, Civil Service (Medical): No    Lack of Transportation (Non-Medical): No  Physical Activity: Sufficiently Active (05/02/2022)   Exercise Vital Sign    Days of Exercise per Week: 6 days    Minutes of Exercise per Session: 60 min  Stress: Stress Concern Present (05/02/2022)   [employer] of  Occupational Health - Occupational Stress Questionnaire    Feeling of Stress : To some extent  Social Connections: Socially Isolated (05/02/2022)   Social Connection and Isolation Panel [NHANES]    Frequency of Communication with Friends and Family: Once a week    Frequency of Social Gatherings with Friends and Family: Never    Attends Religious Services: Never    Database administrator or Organizations: No    Attends Banker Meetings: Never    Marital Status: Married  Catering manager Violence: Not At Risk (05/02/2022)   Humiliation, Afraid, Rape, and Kick questionnaire    Fear of Current or Ex-Partner: No    Emotionally Abused: No    Physically Abused: No    Sexually Abused: No    Outpatient Medications Prior to Visit  Medication Sig Dispense Refill   dicyclomine (BENTYL) 20 MG tablet Take 20 mg by mouth daily as needed.     gabapentin (NEURONTIN) 300 MG capsule Take 2 capsules (600 mg total) by mouth at bedtime. 180 capsule 1   lisinopril (ZESTRIL) 20 MG tablet Take 1 tablet (20 mg total) by mouth daily. 90 tablet 1   Probiotic Product (PROBIOTIC PO) Take by mouth.     simethicone (MYLICON) 125 MG chewable tablet Chew by mouth.     No facility-administered medications prior to visit.    Allergies  Allergen Reactions   Codeine Nausea And Vomiting   Sleeping [Diphenhydramine] Other (See Comments)    All sleeping medication reverse     Review of Systems  Constitutional:  Positive for malaise/fatigue. Negative for fever.  HENT:  Negative for congestion.   Eyes:  Negative for blurred vision.  Respiratory:  Positive for cough and sputum production. Negative for shortness of breath.   Cardiovascular:  Negative for chest pain, palpitations and leg swelling.  Gastrointestinal:  Negative for abdominal pain, blood in stool and nausea.  Genitourinary:  Negative for dysuria and frequency.  Musculoskeletal:  Negative for falls.  Skin:  Negative for rash.  Neurological:   Positive for headaches. Negative for dizziness and loss of consciousness.  Endo/Heme/Allergies:  Negative for environmental allergies.  Psychiatric/Behavioral:  Negative for depression. The patient is not nervous/anxious.        Objective:    Physical Exam Constitutional:      General: She is not in acute distress.    Appearance: Normal appearance. She is not ill-appearing or toxic-appearing.  HENT:     Head: Normocephalic and atraumatic.     Right Ear: External ear normal.     Left Ear: External ear normal.     Nose: Nose normal.  Eyes:  General:        Right eye: No discharge.        Left eye: No discharge.  Pulmonary:     Effort: Pulmonary effort is normal.  Skin:    Findings: No rash.  Neurological:     Mental Status: She is alert and oriented to person, place, and time.  Psychiatric:        Behavior: Behavior normal.     There were no vitals taken for this visit. Wt Readings from Last 3 Encounters:  01/05/22 158 lb 3.2 oz (71.8 kg)  11/10/21 162 lb (73.5 kg)  02/09/20 161 lb (73 kg)       Assessment & Plan:  COVID-19 Assessment & Plan: Symptoms started last night with headache, low grade temp 100.3 with Advil, fatigue, anorexia, mild congestion and dry cough (mild), tested positive for COVID at home this morning Will start on Paxlovid 3 caps bid x 5 days. Increase rest and hydration. Monitor O2 sats and seek care if worsens   Essential hypertension Assessment & Plan: Monitor and report any concerns.   Other orders -     nirmatrelvir/ritonavir EUA; Take 3 tablets by mouth 2 (two) times daily for 5 days. (Take nirmatrelvir 150 mg two tablets twice daily for 5 days and ritonavir 100 mg one tablet twice daily for 5 days) Patient GFR is 65  Dispense: 30 tablet; Refill: 0     I discussed the assessment and treatment plan with the patient. The patient was provided an opportunity to ask questions and all were answered. The patient agreed with the plan and  demonstrated an understanding of the instructions.   The patient was advised to call back or seek an in-person evaluation if the symptoms worsen or if the condition fails to improve as anticipated.  Danise Edge, MD [facility] at [facility] [phone] (phone) [phone] (fax)  [facility]

## 2022-06-18 NOTE — Assessment & Plan Note (Signed)
Symptoms started last night with headache, low grade temp 100.3 with Advil, fatigue, anorexia, mild congestion and dry cough (mild), tested positive for COVID at home this morning Will start on Paxlovid 3 caps bid x 5 days. Increase rest and hydration. Monitor O2 sats and seek care if worsens

## 2022-06-20 ENCOUNTER — Encounter: Payer: Self-pay | Admitting: Family Medicine

## 2022-06-20 NOTE — Telephone Encounter (Signed)
Called pt to check on her and spoke with Cyndee Brightly  Was advised if not experiencing shortness of breath an if symptoms does not worsen  To continue medication. It may take few days before may start to feet better, if thing worsen go to ER.

## 2022-07-03 ENCOUNTER — Encounter: Payer: Self-pay | Admitting: Family Medicine

## 2022-07-05 ENCOUNTER — Ambulatory Visit (INDEPENDENT_AMBULATORY_CARE_PROVIDER_SITE_OTHER): Payer: Medicare HMO | Admitting: Family Medicine

## 2022-07-05 ENCOUNTER — Encounter: Payer: Self-pay | Admitting: Family Medicine

## 2022-07-05 ENCOUNTER — Ambulatory Visit (HOSPITAL_BASED_OUTPATIENT_CLINIC_OR_DEPARTMENT_OTHER)
Admission: RE | Admit: 2022-07-05 | Discharge: 2022-07-05 | Disposition: A | Discharge status: Home / Self Care | Payer: Medicare HMO | Source: Ambulatory Visit | Attending: Family Medicine | Admitting: Family Medicine

## 2022-07-05 VITALS — BP 145/68 | HR 75 | Temp 97.9°F | Resp 16 | Ht 67.0 in | Wt 154.2 lb

## 2022-07-05 DIAGNOSIS — R059 Cough, unspecified: Secondary | ICD-10-CM | POA: Diagnosis not present

## 2022-07-05 DIAGNOSIS — U099 Post covid-19 condition, unspecified: Secondary | ICD-10-CM | POA: Insufficient documentation

## 2022-07-05 DIAGNOSIS — R053 Chronic cough: Secondary | ICD-10-CM

## 2022-07-05 DIAGNOSIS — R5383 Other fatigue: Secondary | ICD-10-CM | POA: Diagnosis not present

## 2022-07-05 MED ORDER — PREDNISONE 20 MG PO TABS: 40.0000 mg | ORAL_TABLET | Freq: Every day | ORAL | 0 refills | Status: AC

## 2022-07-05 MED ORDER — BENZONATATE 200 MG PO CAPS: 200.0000 mg | ORAL_CAPSULE | Freq: Two times a day (BID) | ORAL | 0 refills | Status: DC | PRN

## 2022-07-05 NOTE — Patient Instructions (Signed)
Possibly post-COVID cough and fatigue, but we can get a chest xray to ensure no pneumonia. Take it easy and don't over do it. Adding prednisone and tessalon. Continue supportive measures including rest, hydration, humidifier use, steam showers, warm compresses to sinuses, warm liquids with lemon and honey, and over-the-counter cough, cold, and analgesics as needed.   Please contact office for follow-up if symptoms do not improve or worsen. Seek emergency care if symptoms become severe.

## 2022-07-05 NOTE — Progress Notes (Signed)
Acute Office Visit  Subjective:     Patient ID: Michele Flores, female    DOB: [birth year], 82 y.o.   MRN: [id number]  Chief Complaint  Patient presents with   post covid    Not feeling better    HPI Patient is in today for post-COVID symptoms.   Patient reports that she was positive for COVID on 06/18/22 and completed Paxlovid. Reports she has since tested negative, but continue to have cough with occasional yellow sputum and fatigue. States symptoms tend to be worse towards the end of the day. She is concerned because she still feels so weak and tired overall and is not able to do the things she usually does. She denies any chest pain, dyspnea, wheezing, fevers, chills, headaches, rhinorrhea, hemoptysis, urinary changes, GI/GU symptoms. She will have some occasional sinus pressure and ear pressure/popping, but otherwise no new symptoms. She has been taking tylenol, ibuprofen, and Mucinex.      ROS All review of systems negative except what is listed in the HPI      Objective:    BP (!) 145/68   Pulse 75   Temp 97.9 F (36.6 C)   Resp 16   Ht 5\' 7"  (1.702 m)   Wt 154 lb 3.2 oz (69.9 kg)   SpO2 98%   BMI 24.15 kg/m    Physical Exam Vitals reviewed.  Constitutional:      Appearance: Normal appearance.  HENT:     Ears:     Comments: Mild mid ear effusions Cardiovascular:     Rate and Rhythm: Normal rate and regular rhythm.     Pulses: Normal pulses.     Heart sounds: Normal heart sounds.  Pulmonary:     Effort: Pulmonary effort is normal.     Breath sounds: Normal breath sounds.  Skin:    General: Skin is warm and dry.  Neurological:     Mental Status: She is alert and oriented to person, place, and time.  Psychiatric:        Mood and Affect: Mood normal.        Behavior: Behavior normal.        Thought Content: Thought content normal.        Judgment: Judgment normal.     No results found for any visits on 07/05/22.      Assessment &  Plan:   Problem List Items Addressed This Visit   None Visit Diagnoses     Post covid-19 condition, unspecified    -  Primary   Relevant Medications   predniSONE (DELTASONE) 20 MG tablet   benzonatate (TESSALON) 200 MG capsule   Other Relevant Orders   DG Chest 2 View   Post-COVID chronic cough       Relevant Medications   predniSONE (DELTASONE) 20 MG tablet   benzonatate (TESSALON) 200 MG capsule   Other Relevant Orders   DG Chest 2 View      Possibly post-COVID cough and fatigue, but we can get a chest xray to ensure no pneumonia. Take it easy and don't over do it. Adding prednisone and tessalon. Continue supportive measures including rest, hydration, humidifier use, steam showers, warm compresses to sinuses, warm liquids with lemon and honey, and over-the-counter cough, cold, and analgesics as needed.   Please contact office for follow-up if symptoms do not improve or worsen. Seek emergency care if symptoms become severe.    Meds ordered this encounter  Medications   predniSONE (DELTASONE) 20 MG  tablet    Sig: Take 2 tablets (40 mg total) by mouth daily with breakfast for 5 days.    Dispense:  10 tablet    Refill:  0    Order Specific Question:   Supervising Provider    Answer:   Penni Homans A [[id number]]   benzonatate (TESSALON) 200 MG capsule    Sig: Take 1 capsule (200 mg total) by mouth 2 (two) times daily as needed for cough.    Dispense:  20 capsule    Refill:  0    Order Specific Question:   Supervising Provider    Answer:   Penni Homans A [[id number]]     Return if symptoms worsen or fail to improve.  Terrilyn Saver, NP

## 2022-07-22 ENCOUNTER — Encounter: Payer: Self-pay | Admitting: Family Medicine

## 2022-07-22 ENCOUNTER — Other Ambulatory Visit: Payer: Self-pay | Admitting: Family Medicine

## 2022-07-22 DIAGNOSIS — F32A Depression, unspecified: Secondary | ICD-10-CM

## 2022-07-22 DIAGNOSIS — Z0000 Encounter for general adult medical examination without abnormal findings: Secondary | ICD-10-CM

## 2022-07-24 ENCOUNTER — Encounter: Payer: Self-pay | Admitting: Family Medicine

## 2022-07-24 ENCOUNTER — Ambulatory Visit (INDEPENDENT_AMBULATORY_CARE_PROVIDER_SITE_OTHER): Payer: Medicare HMO | Admitting: Family Medicine

## 2022-07-24 VITALS — BP 138/72 | HR 93 | Temp 97.6°F | Resp 16 | Ht 67.5 in | Wt 152.2 lb

## 2022-07-24 DIAGNOSIS — F419 Anxiety disorder, unspecified: Secondary | ICD-10-CM

## 2022-07-24 DIAGNOSIS — J0140 Acute pansinusitis, unspecified: Secondary | ICD-10-CM

## 2022-07-24 DIAGNOSIS — F32A Depression, unspecified: Secondary | ICD-10-CM | POA: Diagnosis not present

## 2022-07-24 MED ORDER — DOXYCYCLINE HYCLATE 100 MG PO TABS: 100.0000 mg | ORAL_TABLET | Freq: Two times a day (BID) | ORAL | 0 refills | Status: AC

## 2022-07-24 NOTE — Progress Notes (Signed)
Acute Office Visit  Subjective:     Patient ID: Michele Flores, female    DOB: 1940-07-21, 82 y.o.   MRN: [id number]  Chief Complaint  Patient presents with   Follow-up    COVID    HPI Patient is in today for on-going URI symptoms.   Patient had COVID 5+ weeks ago. She was treated with Paxlovid initially. Lingering symptoms were treated with prednisone with some improvement. Now she is reporting ongoing sinus congestion, post nasal drainage, feeling like she has a glob of mucus in her throat that she has to keep clearing, productive cough at times. She denies any headaches, new fevers, chills, GI/GU symptoms. OTC regimens have not been helpful.   Additionally, she reports she has felt a little more anxious the past month or so. She has failed many SNRI/SSRIs, and reports gabapentin has been the only thing that has worked for her. She had cut back to 2 capsules (300 mg each) at night and that was doing well for awhile, until recently. She decided to cut back to 1 capsule to see if the gabapentin was making any difference at all and she realized that it was. She is wondering if she can go back to the 3 capsules a day that she used to do as that seemed to help in the past. No SI/HI.    ROS All review of systems negative except what is listed in the HPI      Objective:    BP 138/72   Pulse 93   Temp 97.6 F (36.4 C)   Resp 16   Ht 5' 7.5" (1.715 m)   Wt 152 lb 3.2 oz (69 kg)   SpO2 97%   BMI 23.49 kg/m    Physical Exam Vitals reviewed.  Constitutional:      Appearance: Normal appearance.  HENT:     Head: Normocephalic and atraumatic.     Right Ear: Tympanic membrane normal.     Left Ear: Tympanic membrane normal.     Nose: Congestion present.     Mouth/Throat:     Mouth: Mucous membranes are moist.     Pharynx: Oropharynx is clear. No posterior oropharyngeal erythema.     Comments: PND/cobblestoning Eyes:     Conjunctiva/sclera: Conjunctivae normal.   Cardiovascular:     Rate and Rhythm: Normal rate and regular rhythm.     Pulses: Normal pulses.     Heart sounds: Normal heart sounds.  Pulmonary:     Effort: Pulmonary effort is normal.     Breath sounds: Normal breath sounds.  Musculoskeletal:     Cervical back: Normal range of motion and neck supple.  Skin:    General: Skin is warm and dry.  Neurological:     Mental Status: She is alert and oriented to person, place, and time.  Psychiatric:        Mood and Affect: Mood normal.        Behavior: Behavior normal.        Thought Content: Thought content normal.        Judgment: Judgment normal.     No results found for any visits on 07/24/22.      Assessment & Plan:   Problem List Items Addressed This Visit       Other   Anxiety and depression For your anxiety, you can try taking one extra tab of gabapentin mid-day like you used to do to see if that helps. Please follow-up if this is  not working.    Other Visit Diagnoses     Acute non-recurrent pansinusitis    -  Primary Given duration of symptoms despite supportive measures, let's try a course of doxycycline.  Continue supportive measures including rest, hydration, humidifier use, steam showers, warm compresses to sinuses, warm liquids with lemon and honey, and over-the-counter cough, cold, and analgesics as needed.    Relevant Medications   doxycycline (VIBRA-TABS) 100 MG tablet       Meds ordered this encounter  Medications   doxycycline (VIBRA-TABS) 100 MG tablet    Sig: Take 1 tablet (100 mg total) by mouth 2 (two) times daily for 7 days.    Dispense:  14 tablet    Refill:  0    Order Specific Question:   Supervising Provider    Answer:   Penni Homans A [[id number]]    Return if symptoms worsen or fail to improve.  Terrilyn Saver, NP

## 2022-07-24 NOTE — Patient Instructions (Addendum)
Given duration of symptoms despite supportive measures, let's try a course of doxycycline.  Continue supportive measures including rest, hydration, humidifier use, steam showers, warm compresses to sinuses, warm liquids with lemon and honey, and over-the-counter cough, cold, and analgesics as needed.    For your anxiety, you can try taking one extra tab of gabapentin mid-day like you used to do to see if that helps. Please follow-up if this is not working.   Please contact office for follow-up if symptoms do not improve or worsen. Seek emergency care if symptoms become severe.

## 2022-09-17 DIAGNOSIS — H524 Presbyopia: Secondary | ICD-10-CM | POA: Diagnosis not present

## 2022-09-17 DIAGNOSIS — Z961 Presence of intraocular lens: Secondary | ICD-10-CM | POA: Diagnosis not present

## 2022-09-17 DIAGNOSIS — Z135 Encounter for screening for eye and ear disorders: Secondary | ICD-10-CM | POA: Diagnosis not present

## 2022-11-16 DIAGNOSIS — Z1231 Encounter for screening mammogram for malignant neoplasm of breast: Secondary | ICD-10-CM | POA: Diagnosis not present

## 2022-11-16 LAB — HM MAMMOGRAPHY

## 2022-12-05 ENCOUNTER — Encounter: Payer: Self-pay | Admitting: Neurology

## 2022-12-12 ENCOUNTER — Encounter: Payer: Self-pay | Admitting: Family Medicine

## 2022-12-14 ENCOUNTER — Encounter: Payer: Self-pay | Admitting: Family Medicine

## 2022-12-14 ENCOUNTER — Ambulatory Visit (INDEPENDENT_AMBULATORY_CARE_PROVIDER_SITE_OTHER): Payer: Medicare HMO | Admitting: Family Medicine

## 2022-12-14 VITALS — BP 98/56 | HR 74 | Ht 67.5 in | Wt 155.0 lb

## 2022-12-14 DIAGNOSIS — L659 Nonscarring hair loss, unspecified: Secondary | ICD-10-CM

## 2022-12-14 LAB — COMPREHENSIVE METABOLIC PANEL
ALT: 11 U/L (ref 0–35)
AST: 18 U/L (ref 0–37)
Albumin: 4.3 g/dL (ref 3.5–5.2)
Alkaline Phosphatase: 77 U/L (ref 39–117)
BUN: 14 mg/dL (ref 6–23)
CO2: 27 mEq/L (ref 19–32)
Calcium: 9.4 mg/dL (ref 8.4–10.5)
Chloride: 102 mEq/L (ref 96–112)
Creatinine, Ser: 0.85 mg/dL (ref 0.40–1.20)
GFR: 64.43 mL/min (ref >60.00)
Glucose, Bld: 94 mg/dL (ref 70–99)
Potassium: 4.6 mEq/L (ref 3.5–5.1)
Sodium: 139 mEq/L (ref 135–145)
Total Bilirubin: 0.7 mg/dL (ref 0.2–1.2)
Total Protein: 6.9 g/dL (ref 6.0–8.3)

## 2022-12-14 LAB — CBC WITH DIFFERENTIAL/PLATELET
Basophils Absolute: 0 10*3/uL (ref 0.0–0.1)
Basophils Relative: 0.4 % (ref 0.0–3.0)
Eosinophils Absolute: 0.1 10*3/uL (ref 0.0–0.7)
Eosinophils Relative: 1.6 % (ref 0.0–5.0)
HCT: 45.9 % (ref 36.0–46.0)
Hemoglobin: 15.1 g/dL — ABNORMAL HIGH (ref 12.0–15.0)
Lymphocytes Relative: 26.1 % (ref 12.0–46.0)
Lymphs Abs: 1.8 10*3/uL (ref 0.7–4.0)
MCHC: 33 g/dL (ref 30.0–36.0)
MCV: 97.1 fl (ref 78.0–100.0)
Monocytes Absolute: 0.7 10*3/uL (ref 0.1–1.0)
Monocytes Relative: 9.9 % (ref 3.0–12.0)
Neutro Abs: 4.2 10*3/uL (ref 1.4–7.7)
Neutrophils Relative %: 62 % (ref 43.0–77.0)
Platelets: 252 10*3/uL (ref 150.0–400.0)
RBC: 4.73 Mil/uL (ref 3.87–5.11)
RDW: 13.1 % (ref 11.5–15.5)
WBC: 6.8 10*3/uL (ref 4.0–10.5)

## 2022-12-14 LAB — IBC + FERRITIN
Ferritin: 125.9 ng/mL (ref 10.0–291.0)
Iron: 133 ug/dL (ref 42–145)
Saturation Ratios: 35.8 % (ref 20.0–50.0)
TIBC: 371 ug/dL (ref 250.0–450.0)
Transferrin: 265 mg/dL (ref 212.0–360.0)

## 2022-12-14 LAB — TSH: TSH: 0.39 u[IU]/mL (ref 0.35–5.50)

## 2022-12-14 NOTE — Progress Notes (Signed)
Acute Office Visit  Subjective:     Patient ID: Michele Flores, female    DOB: 1941/02/03, 82 y.o.   MRN: [id number]  Chief Complaint  Patient presents with   Medical Management of Chronic Issues    HPI Patient is in today for hair loss.  Discussed the use of AI scribe software for clinical note transcription with the patient, who gave verbal consent to proceed.  History of Present Illness   The patient, with a history of COVID-19 infection, presents with hair loss that she believes started after her COVID-19 infection. She reports that she initially noticed increased hair shedding after washing or brushing her hair, but it has since progressed to noticeable thinning, particularly at the crown of her head. She has been using 2% minoxidil for about three and a half weeks without any adverse reactions, but has not noticed any improvement yet. She also reports lingering fatigue post-COVID-19 infection, which she describes as needing to rest after doing housework. She has a history of thyroid concerns, including a previous thyroid biopsy, but no current symptoms of thyroid dysfunction.           ROS All review of systems negative except what is listed in the HPI      Objective:    BP (!) 98/56   Pulse 74   Ht 5' 7.5" (1.715 m)   Wt 155 lb (70.3 kg)   SpO2 97%   BMI 23.92 kg/m    Physical Exam Vitals reviewed.  Constitutional:      Appearance: Normal appearance.  HENT:     Head:     Comments: Generalized hair thinning, no bald spots Pulmonary:     Effort: Pulmonary effort is normal.  Musculoskeletal:     Cervical back: Normal range of motion and neck supple. No tenderness.  Lymphadenopathy:     Cervical: No cervical adenopathy.  Skin:    General: Skin is warm and dry.  Neurological:     General: No focal deficit present.     Mental Status: She is alert and oriented to person, place, and time.  Psychiatric:        Mood and Affect: Mood normal.         Behavior: Behavior normal.        Thought Content: Thought content normal.        Judgment: Judgment normal.     Results for orders placed or performed in visit on 12/14/22  CBC with Differential/Platelet  Result Value Ref Range   WBC 6.8 4.0 - 10.5 K/uL   RBC 4.73 3.87 - 5.11 Mil/uL   Hemoglobin 15.1 (H) 12.0 - 15.0 g/dL   HCT 40.9 81.1 - 91.4 %   MCV 97.1 78.0 - 100.0 fl   MCHC 33.0 30.0 - 36.0 g/dL   RDW 78.2 95.6 - 21.3 %   Platelets 252.0 150.0 - 400.0 K/uL   Neutrophils Relative % 62.0 43.0 - 77.0 %   Lymphocytes Relative 26.1 12.0 - 46.0 %   Monocytes Relative 9.9 3.0 - 12.0 %   Eosinophils Relative 1.6 0.0 - 5.0 %   Basophils Relative 0.4 0.0 - 3.0 %   Neutro Abs 4.2 1.4 - 7.7 K/uL   Lymphs Abs 1.8 0.7 - 4.0 K/uL   Monocytes Absolute 0.7 0.1 - 1.0 K/uL   Eosinophils Absolute 0.1 0.0 - 0.7 K/uL   Basophils Absolute 0.0 0.0 - 0.1 K/uL  Comprehensive metabolic panel  Result Value Ref Range   Sodium  139 135 - 145 mEq/L   Potassium 4.6 3.5 - 5.1 mEq/L   Chloride 102 96 - 112 mEq/L   CO2 27 19 - 32 mEq/L   Glucose, Bld 94 70 - 99 mg/dL   BUN 14 6 - 23 mg/dL   Creatinine, Ser 8.29 0.40 - 1.20 mg/dL   Total Bilirubin 0.7 0.2 - 1.2 mg/dL   Alkaline Phosphatase 77 39 - 117 U/L   AST 18 0 - 37 U/L   ALT 11 0 - 35 U/L   Total Protein 6.9 6.0 - 8.3 g/dL   Albumin 4.3 3.5 - 5.2 g/dL   GFR 56.21 >30.86 mL/min   Calcium 9.4 8.4 - 10.5 mg/dL  TSH  Result Value Ref Range   TSH 0.39 0.35 - 5.50 uIU/mL  IBC + Ferritin  Result Value Ref Range   Iron 133 42 - 145 ug/dL   Transferrin 578.4 696.2 - 360.0 mg/dL   Saturation Ratios 95.2 20.0 - 50.0 %   Ferritin 125.9 10.0 - 291.0 ng/mL   TIBC 371.0 250.0 - 450.0 mcg/dL        Assessment & Plan:   Problem List Items Addressed This Visit   None Visit Diagnoses     Hair loss    -  Primary   Relevant Orders   Ambulatory referral to Dermatology   CBC with Differential/Platelet (Completed)   Comprehensive metabolic panel  (Completed)   TSH (Completed)   IBC + Ferritin (Completed)         Hair Loss: Noted after COVID-19 infection, with thinning especially at the crown of the head. No bald patches. Currently using Minoxidil 2% twice daily for 3.5 weeks without adverse reactions. -Order comprehensive blood work to rule out systemic causes such as thyroid dysfunction. -Refer to Dermatology for further evaluation and management. -Consider increasing Minoxidil to 5% once daily.         No orders of the defined types were placed in this encounter.   Return if symptoms worsen or fail to improve.  Clayborne Dana, NP

## 2022-12-17 DIAGNOSIS — L650 Telogen effluvium: Secondary | ICD-10-CM | POA: Diagnosis not present

## 2022-12-18 ENCOUNTER — Other Ambulatory Visit: Payer: Self-pay | Admitting: Family Medicine

## 2022-12-18 DIAGNOSIS — I10 Essential (primary) hypertension: Secondary | ICD-10-CM

## 2023-01-07 ENCOUNTER — Ambulatory Visit (INDEPENDENT_AMBULATORY_CARE_PROVIDER_SITE_OTHER): Payer: Medicare HMO | Admitting: Family Medicine

## 2023-01-07 ENCOUNTER — Encounter: Payer: Self-pay | Admitting: Family Medicine

## 2023-01-07 VITALS — BP 131/69 | HR 79 | Temp 97.6°F | Resp 18 | Ht 67.5 in | Wt 155.0 lb

## 2023-01-07 DIAGNOSIS — I10 Essential (primary) hypertension: Secondary | ICD-10-CM | POA: Diagnosis not present

## 2023-01-07 DIAGNOSIS — E782 Mixed hyperlipidemia: Secondary | ICD-10-CM

## 2023-01-07 DIAGNOSIS — F419 Anxiety disorder, unspecified: Secondary | ICD-10-CM

## 2023-01-07 DIAGNOSIS — Z0000 Encounter for general adult medical examination without abnormal findings: Secondary | ICD-10-CM

## 2023-01-07 DIAGNOSIS — F32A Depression, unspecified: Secondary | ICD-10-CM

## 2023-01-07 MED ORDER — GABAPENTIN 300 MG PO CAPS
600.0000 mg | ORAL_CAPSULE | Freq: Every day | ORAL | 3 refills | Status: AC
Start: 2023-01-07 — End: ?

## 2023-01-07 MED ORDER — LISINOPRIL 20 MG PO TABS
20.0000 mg | ORAL_TABLET | Freq: Every day | ORAL | 1 refills | Status: AC
Start: 2023-01-07 — End: ?

## 2023-01-07 NOTE — Assessment & Plan Note (Signed)
Blood pressure is at goal for age and co-morbidities.   Recommendations: continue lisinopril 20 mg daily - BP goal <130/80 - monitor and log blood pressures at home - check around the same time each day in a relaxed setting - Limit salt to <2000 mg/day - Follow DASH eating plan (heart healthy diet) - limit alcohol to 2 standard drinks per day for men and 1 per day for women - avoid tobacco products - get at least 2 hours of regular aerobic exercise weekly Patient aware of signs/symptoms requiring further/urgent evaluation. Labs recently stable.

## 2023-01-07 NOTE — Assessment & Plan Note (Signed)
No SI/HI Doing well overall. She has been under some stress lately with her brother recently passing and having to help with all of his arrangements

## 2023-01-07 NOTE — Assessment & Plan Note (Signed)
Medication management: none, lifestyle measures only  Lifestyle factors for lowering cholesterol include: Diet therapy - heart-healthy diet rich in fruits, veggies, fiber-rich whole grains, lean meats, chicken, fish (at least twice a week), fat-free or 1% dairy products; foods low in saturated/trans fats, cholesterol, sodium, and sugar. Mediterranean diet has shown to be very heart healthy. Regular exercise - recommend at least 30 minutes a day, 5 times per week Weight management  Declined labs today

## 2023-01-07 NOTE — Progress Notes (Signed)
Complete physical exam  Patient: Michele Flores   DOB: [birth year]   82 y.o. Female  MRN: [id number]  Subjective:    Chief Complaint  Patient presents with   Annual Exam    Concerns/ questions: pt says she is now on     Berea Nogueras is a 82 y.o. female who presents today for a complete physical exam. She reports consuming a general diet. Home exercise routine includes home video workout 6 days per week. She generally feels well. She reports sleeping fairly well. She does not have additional problems to discuss today.   Currently lives with: husband Acute concerns or interim problems since last visit: Her brother passed a few weeks ago, so she has been struggling from that, but doing okay overall.   Vision concerns: no concerns Dental concerns: no concerns   Patient endorses ETOH use. - wine with dinner most nights Patient denies nicotine use. Patient denies illegal substance use.      Most recent fall risk assessment:    01/07/2023    9:04 AM  Fall Risk   Falls in the past year? 0  Number falls in past yr: 0  Injury with Fall? 0  Risk for fall due to : No Fall Risks  Follow up Falls evaluation completed     Most recent depression screenings:    01/07/2023    9:04 AM 05/02/2022    9:47 AM  PHQ 2/9 Scores  PHQ - 2 Score 1 0            Patient Care Team: Clayborne Dana, NP as PCP - General (Family Medicine)   Outpatient Medications Prior to Visit  Medication Sig   dicyclomine (BENTYL) 20 MG tablet Take 20 mg by mouth daily as needed.   MINOXIDIL, TOPICAL, 5 % SOLN Apply topically.   [DISCONTINUED] gabapentin (NEURONTIN) 300 MG capsule TAKE 2 CAPSULES AT BEDTIME   [DISCONTINUED] lisinopril (ZESTRIL) 20 MG tablet TAKE 1 TABLET BY MOUTH EVERY DAY   [DISCONTINUED] Cyanocobalamin (VITAMIN B 12) 250 MCG LOZG    No facility-administered medications prior to visit.    ROS All review of systems negative except what is listed in the  HPI        Objective:     BP 131/69 (BP Location: Right Arm, Patient Position: Sitting, Cuff Size: Normal)   Pulse 79   Temp 97.6 F (36.4 C) (Oral)   Resp 18   Ht 5' 7.5" (1.715 m)   Wt 155 lb (70.3 kg)   SpO2 99%   BMI 23.92 kg/m    Physical Exam Vitals reviewed.  Constitutional:      General: She is not in acute distress.    Appearance: Normal appearance. She is not ill-appearing.  HENT:     Head: Normocephalic and atraumatic.     Right Ear: Tympanic membrane normal.     Left Ear: Tympanic membrane normal.     Nose: Nose normal.     Mouth/Throat:     Mouth: Mucous membranes are moist.     Pharynx: Oropharynx is clear.  Eyes:     Extraocular Movements: Extraocular movements intact.     Conjunctiva/sclera: Conjunctivae normal.     Pupils: Pupils are equal, round, and reactive to light.  Neck:     Vascular: No carotid bruit.  Cardiovascular:     Rate and Rhythm: Normal rate and regular rhythm.     Pulses: Normal pulses.     Heart sounds: Normal heart  sounds.  Pulmonary:     Effort: Pulmonary effort is normal.     Breath sounds: Normal breath sounds.  Abdominal:     General: Abdomen is flat. Bowel sounds are normal. There is no distension.     Palpations: Abdomen is soft. There is no mass.     Tenderness: There is no abdominal tenderness. There is no right CVA tenderness, left CVA tenderness, guarding or rebound.  Genitourinary:    Comments: Deferred exam Musculoskeletal:        General: Normal range of motion.     Cervical back: Normal range of motion and neck supple. No tenderness.     Right lower leg: No edema.     Left lower leg: No edema.  Lymphadenopathy:     Cervical: No cervical adenopathy.  Skin:    General: Skin is warm and dry.     Capillary Refill: Capillary refill takes less than 2 seconds.  Neurological:     General: No focal deficit present.     Mental Status: She is alert and oriented to person, place, and time. Mental status is at  baseline.  Psychiatric:        Mood and Affect: Mood normal.        Behavior: Behavior normal.        Thought Content: Thought content normal.        Judgment: Judgment normal.      No results found for any visits on 01/07/23.     Assessment & Plan:    Routine Health Maintenance and Physical Exam Discussed health promotion and safety including diet and exercise recommendations, dental health, and injury prevention. Tobacco cessation if applicable. Seat belts, sunscreen, smoke detectors, etc.    Immunization History  Administered Date(s) Administered   Influenza, High Dose Seasonal PF 05/12/2013, 04/01/2014, 03/25/2021, 03/24/2022   PFIZER Comirnaty(Gray Top)Covid-19 Tri-Sucrose Vaccine 07/12/2019, 08/01/2019, 03/09/2021   PFIZER(Purple Top)SARS-COV-2 Vaccination 07/12/2019, 08/01/2019   Pneumococcal Conjugate-13 05/12/2013   Pneumococcal Polysaccharide-23 04/02/2007   Pneumococcal-Unspecified 07/02/2013   Respiratory Syncytial Virus Vaccine,Recomb Aduvanted(Arexvy) 05/12/2022   Td 09/06/2003, 06/29/2014   Zoster Recombinant(Shingrix) 01/07/2019, 04/08/2019    Health Maintenance  Topic Date Due   COVID-19 Vaccine (6 - 2023-24 season) 12/13/2023 (Originally 03/02/2022)   DEXA SCAN  12/14/2023 (Originally 12/05/2006)   INFLUENZA VACCINE  01/31/2023   Medicare Annual Wellness (AWV)  05/03/2023   DTaP/Tdap/Td (3 - Tdap) 06/29/2024   Pneumonia Vaccine 39+ Years old  Completed   Zoster Vaccines- Shingrix  Completed   HPV VACCINES  Aged Out       Problem List Items Addressed This Visit     Anxiety and depression    No SI/HI Doing well overall. She has been under some stress lately with her brother recently passing and having to help with all of his arrangements      Relevant Medications   gabapentin (NEURONTIN) 300 MG capsule   Essential hypertension    Blood pressure is at goal for age and co-morbidities.   Recommendations: continue lisinopril 20 mg daily - BP goal  <130/80 - monitor and log blood pressures at home - check around the same time each day in a relaxed setting - Limit salt to <2000 mg/day - Follow DASH eating plan (heart healthy diet) - limit alcohol to 2 standard drinks per day for men and 1 per day for women - avoid tobacco products - get at least 2 hours of regular aerobic exercise weekly Patient aware of signs/symptoms requiring further/urgent evaluation.  Labs recently stable.       Relevant Medications   lisinopril (ZESTRIL) 20 MG tablet   Hyperlipidemia    Medication management: none, lifestyle measures only  Lifestyle factors for lowering cholesterol include: Diet therapy - heart-healthy diet rich in fruits, veggies, fiber-rich whole grains, lean meats, chicken, fish (at least twice a week), fat-free or 1% dairy products; foods low in saturated/trans fats, cholesterol, sodium, and sugar. Mediterranean diet has shown to be very heart healthy. Regular exercise - recommend at least 30 minutes a day, 5 times per week Weight management  Declined labs today        Relevant Medications   lisinopril (ZESTRIL) 20 MG tablet   Other Visit Diagnoses     Annual physical exam    -  Primary   Relevant Medications   gabapentin (NEURONTIN) 300 MG capsule      Return in about 1 year (around 01/07/2024) for physical.     Clayborne Dana, NP

## 2023-01-22 DIAGNOSIS — L814 Other melanin hyperpigmentation: Secondary | ICD-10-CM | POA: Diagnosis not present

## 2023-01-22 DIAGNOSIS — L821 Other seborrheic keratosis: Secondary | ICD-10-CM | POA: Diagnosis not present

## 2023-01-22 DIAGNOSIS — D1801 Hemangioma of skin and subcutaneous tissue: Secondary | ICD-10-CM | POA: Diagnosis not present

## 2023-04-19 ENCOUNTER — Encounter: Payer: Self-pay | Admitting: Pediatrics

## 2023-05-07 ENCOUNTER — Ambulatory Visit: Payer: Medicare HMO | Admitting: *Deleted

## 2023-05-07 DIAGNOSIS — Z0000 Encounter for general adult medical examination without abnormal findings: Secondary | ICD-10-CM | POA: Diagnosis not present

## 2023-05-07 NOTE — Progress Notes (Signed)
Subjective:   Michele Flores is a 82 y.o. female who presents for Medicare Annual (Subsequent) preventive examination.  Visit Complete: Virtual I connected with  [name] on 05/07/23 by a audio enabled telemedicine application and verified that I am speaking with the correct person using two identifiers.  Patient Location: Home  Provider Location: Office/Clinic  I discussed the limitations of evaluation and management by telemedicine. The patient expressed understanding and agreed to proceed.  Vital Signs: Because this visit was a virtual/telehealth visit, some criteria may be missing or patient reported. Any vitals not documented were not able to be obtained and vitals that have been documented are patient reported.  Patient Medicare AWV questionnaire was completed by the patient on 05/06/23; I have confirmed that all information answered by patient is correct and no changes since this date.  Cardiac Risk Factors include: advanced age (>53men, >48 women);dyslipidemia;hypertension     Objective:    There were no vitals filed for this visit. There is no height or weight on file to calculate BMI.     05/07/2023    9:43 AM 05/02/2022    9:46 AM 02/09/2020   11:48 AM 02/04/2020    9:10 AM  Advanced Directives  Does Patient Have a Medical Advance Directive? Yes Yes Yes Yes  Type of Estate agent of [place];Living will Healthcare Power of [place];Living will Healthcare Power of [place];Living will Living will;Healthcare Power of Attorney  Does patient want to make changes to medical advance directive? No - Patient declined No - Patient declined No - Guardian declined   Copy of Healthcare Power of Attorney in Chart? No - copy requested No - copy requested  No - copy requested    Current Medications (verified) Outpatient Encounter Medications as of 05/07/2023  Medication Sig   dicyclomine (BENTYL) 20 MG tablet Take 20 mg by mouth daily as  needed.   gabapentin (NEURONTIN) 300 MG capsule Take 2 capsules (600 mg total) by mouth at bedtime.   lisinopril (ZESTRIL) 20 MG tablet Take 1 tablet (20 mg total) by mouth daily.   MINOXIDIL, TOPICAL, 5 % SOLN Apply topically.   No facility-administered encounter medications on file as of 05/07/2023.    Allergies (verified) Codeine and Sleeping [diphenhydramine]   History: Past Medical History:  Diagnosis Date   Allergy    Anxiety    Arthritis 2009   Depression    Hypertension    IBS (irritable bowel syndrome)    Past Surgical History:  Procedure Laterality Date   Colonization of Uterus   1978   Checking for Cancer   EYE SURGERY  2019   Cataract surgery   HERNIA REPAIR  Aug., 2021   TUBAL LIGATION  1974   UPPER GI ENDOSCOPY N/A 02/09/2020   Procedure: UPPER GI ENDOSCOPY;  Surgeon: Gaynelle Adu, MD;  Location: [facility];  Service: General;  Laterality: N/A;   XI ROBOTIC ASSISTED HIATAL HERNIA REPAIR N/A 02/09/2020   Procedure: XI ROBOTIC ASSISTED HIATAL HERNIA REPAIR WITH PARTIAL TOUPETFUNDOPLICATION;  Surgeon: Gaynelle Adu, MD;  Location: [facility];  Service: General;  Laterality: N/A;   Family History  Problem Relation Age of Onset   Drug abuse Mother    Aneurysm Mother    Early death Mother    Hearing loss Mother    Cancer Father    Cancer Brother    Mental illness Brother    Diabetes Son    Early death Son    Cancer Son  Kidney disease Son    Mental illness Son    Social History   Socioeconomic History   Marital status: Married    Spouse name: Not on file   Number of children: Not on file   Years of education: Not on file   Highest education level: Master's degree (e.g., MA, MS, MEng, MEd, MSW, MBA)  Occupational History   Not on file  Tobacco Use   Smoking status: Former    Current packs/day: 0.00    Average packs/day: 0.5 packs/day for 20.0 years (10.0 ttl pk-yrs)    Types: Cigarettes    Start date: 01/26/1961    Quit date: 01/26/1981    Years since  quitting: 42.3   Smokeless tobacco: Never  Vaping Use   Vaping status: Never Used  Substance and Sexual Activity   Alcohol use: Yes    Alcohol/week: 1.0 standard drink of alcohol    Types: 1 Glasses of wine per week    Comment: wine   Drug use: Never   Sexual activity: Not Currently  Other Topics Concern   Not on file  Social History Narrative   Not on file   Social Determinants of Health   Financial Resource Strain: Low Risk  (05/06/2023)   Overall Financial Resource Strain (CARDIA)    Difficulty of Paying Living Expenses: Not hard at all  Food Insecurity: No Food Insecurity (05/06/2023)   Hunger Vital Sign    Worried About Running Out of Food in the Last Year: Never true    Ran Out of Food in the Last Year: Never true  Transportation Needs: No Transportation Needs (05/06/2023)   PRAPARE - Administrator, Civil Service (Medical): No    Lack of Transportation (Non-Medical): No  Physical Activity: Sufficiently Active (05/06/2023)   Exercise Vital Sign    Days of Exercise per Week: 6 days    Minutes of Exercise per Session: 70 min  Stress: No Stress Concern Present (05/06/2023)   [employer] of Occupational Health - Occupational Stress Questionnaire    Feeling of Stress : Only a little  Social Connections: Unknown (05/06/2023)   Social Connection and Isolation Panel [NHANES]    Frequency of Communication with Friends and Family: Three times a week    Frequency of Social Gatherings with Friends and Family: Patient declined    Attends Religious Services: Never    Diplomatic Services operational officer: Patient declined    Attends Engineer, structural: Patient declined    Marital Status: Married    Tobacco Counseling Counseling given: Not Answered   Clinical Intake:  Pre-visit preparation completed: Yes  Pain : No/denies pain  Nutritional Risks: None Diabetes: No  How often do you need to have someone help you when you read instructions,  pamphlets, or other written materials from your doctor or pharmacy?: 2 - Rarely  Interpreter Needed?: No  Information entered by :: [employer], CMA   Activities of Daily Living    05/06/2023   10:38 AM  In your present state of health, do you have any difficulty performing the following activities:  Hearing? 1  Comment wears hearing aids  Vision? 0  Difficulty concentrating or making decisions? 0  Walking or climbing stairs? 0  Dressing or bathing? 0  Doing errands, shopping? 0  Preparing Food and eating ? N  Using the Toilet? N  In the past six months, have you accidently leaked urine? Y  Do you have problems with loss of bowel  control? N  Managing your Medications? N  Managing your Finances? N  Housekeeping or managing your Housekeeping? N    Patient Care Team: Clayborne Dana, NP as PCP - General (Family Medicine)  Indicate any recent Medical Services you may have received from other than [facility] providers in the past year (date may be approximate).     Assessment:   This is a routine wellness examination for [place].  Hearing/Vision screen No results found.   Goals Addressed   None    Depression Screen    05/07/2023    9:44 AM 01/07/2023    9:04 AM 05/02/2022    9:47 AM 01/05/2022    8:57 AM 11/10/2021    9:43 AM  PHQ 2/9 Scores  PHQ - 2 Score 0 1 0 0 0    Fall Risk    05/06/2023   10:38 AM 01/07/2023    9:04 AM 05/02/2022    9:46 AM 05/01/2022    7:27 AM 01/05/2022    8:57 AM  Fall Risk   Falls in the past year? 0 0 0 0 0  Number falls in past yr: 0 0 0 0 0  Injury with Fall? 0 0 0 0 0  Risk for fall due to : No Fall Risks No Fall Risks No Fall Risks  No Fall Risks  Follow up Falls evaluation completed Falls evaluation completed Falls evaluation completed  Falls evaluation completed    MEDICARE RISK AT HOME: Medicare Risk at Home Any stairs in or around the home?: Yes If so, are there any without handrails?: Yes Home free of loose throw rugs in walkways,  pet beds, electrical cords, etc?: Yes Adequate lighting in your home to reduce risk of falls?: Yes Life alert?: No Use of a cane, walker or w/c?: No Grab bars in the bathroom?: Yes Shower chair or bench in shower?: Yes Elevated toilet seat or a handicapped toilet?: Yes  TIMED UP AND GO:  Was the test performed?  No    Cognitive Function:        05/07/2023    9:46 AM 05/02/2022    9:50 AM  6CIT Screen  What Year? 0 points 0 points  What month? 0 points 0 points  What time? 0 points 0 points  Count back from 20 0 points 0 points  Months in reverse 0 points 0 points  Repeat phrase 0 points 0 points  Total Score 0 points 0 points    Immunizations Immunization History  Administered Date(s) Administered   Influenza, High Dose Seasonal PF 05/12/2013, 04/01/2014, 03/25/2021, 03/24/2022   Influenza-Unspecified 04/03/2023   PFIZER Comirnaty(Gray Top)Covid-19 Tri-Sucrose Vaccine 07/12/2019, 08/01/2019, 03/09/2021   PFIZER(Purple Top)SARS-COV-2 Vaccination 07/12/2019, 08/01/2019   Pneumococcal Conjugate-13 05/12/2013   Pneumococcal Polysaccharide-23 04/02/2007   Pneumococcal-Unspecified 07/02/2013   Respiratory Syncytial Virus Vaccine,Recomb Aduvanted(Arexvy) 05/12/2022   Td 09/06/2003, 06/29/2014   Unspecified SARS-COV-2 Vaccination 04/03/2023   Zoster Recombinant(Shingrix) 01/07/2019, 04/08/2019    TDAP status: Up to date  Flu Vaccine status: Up to date  Pneumococcal vaccine status: Up to date  Covid-19 vaccine status: Completed vaccines  Qualifies for Shingles Vaccine? Yes   Zostavax completed No   Shingrix Completed?: Yes  Screening Tests Health Maintenance  Topic Date Due   Medicare Annual Wellness (AWV)  05/03/2023   DEXA SCAN  12/14/2023 (Originally 12/05/2006)   COVID-19 Vaccine (7 - 2023-24 season) 05/29/2023   DTaP/Tdap/Td (3 - Tdap) 06/29/2024   Pneumonia Vaccine 42+ Years old  Completed   INFLUENZA  VACCINE  Completed   Zoster Vaccines- Shingrix  Completed    HPV VACCINES  Aged Out    Health Maintenance  Health Maintenance Due  Topic Date Due   Medicare Annual Wellness (AWV)  05/03/2023    Colorectal cancer screening: No longer required.   Mammogram status: Completed 11/16/22. Repeat every year  Bone Density status: pt declined  Lung Cancer Screening: (Low Dose CT Chest recommended if Age 72-80 years, 20 pack-year currently smoking OR have quit w/in 15years.) does not qualify.   Additional Screening:  Hepatitis C Screening: does not qualify  Vision Screening: Recommended annual ophthalmology exams for early detection of glaucoma and other disorders of the eye. Is the patient up to date with their annual eye exam?  Yes  Who is the provider or what is the name of the office in which the patient attends annual eye exams? [facility] If pt is not established with a provider, would they like to be referred to a provider to establish care? No .   Dental Screening: Recommended annual dental exams for proper oral hygiene  Diabetic Foot Exam: N/a  Community Resource Referral / Chronic Care Management: CRR required this visit?  No   CCM required this visit?  No     Plan:     I have personally reviewed and noted the following in the patient's chart:   Medical and social history Use of alcohol, tobacco or illicit drugs  Current medications and supplements including opioid prescriptions. Patient is not currently taking opioid prescriptions. Functional ability and status Nutritional status Physical activity Advanced directives List of other physicians Hospitalizations, surgeries, and ER visits in previous 12 months Vitals Screenings to include cognitive, depression, and falls Referrals and appointments  In addition, I have reviewed and discussed with patient certain preventive protocols, quality metrics, and best practice recommendations. A written personalized care plan for preventive services as well as general preventive health  recommendations were provided to patient.     Donne Anon, CMA   05/07/2023   After Visit Summary: (MyChart) Due to this being a telephonic visit, the after visit summary with patients personalized plan was offered to patient via MyChart   Nurse Notes: None

## 2023-05-07 NOTE — Patient Instructions (Signed)
[name] , Thank you for taking time to come for your Medicare Wellness Visit. I appreciate your ongoing commitment to your health goals. Please review the following plan we discussed and let me know if I can assist you in the future.   These are the goals we discussed:  Goals   None     This is a list of the screening recommended for you and due dates:  Health Maintenance  Topic Date Due   DEXA scan (bone density measurement)  12/14/2023*   COVID-19 Vaccine (7 - 2023-24 season) 05/29/2023   Medicare Annual Wellness Visit  05/06/2024   DTaP/Tdap/Td vaccine (3 - Tdap) 06/29/2024   Pneumonia Vaccine  Completed   Flu Shot  Completed   Zoster (Shingles) Vaccine  Completed   HPV Vaccine  Aged Out  *Topic was postponed. The date shown is not the original due date.     Next appointment: Follow up in one year for your annual wellness visit    Preventive Care 65 Years and Older, Female Preventive care refers to lifestyle choices and visits with your health care provider that can promote health and wellness. What does preventive care include? A yearly physical exam. This is also called an annual well check. Dental exams once or twice a year. Routine eye exams. Ask your health care provider how often you should have your eyes checked. Personal lifestyle choices, including: Daily care of your teeth and gums. Regular physical activity. Eating a healthy diet. Avoiding tobacco and drug use. Limiting alcohol use. Practicing safe sex. Taking low-dose aspirin every day. Taking vitamin and mineral supplements as recommended by your health care provider. What happens during an annual well check? The services and screenings done by your health care provider during your annual well check will depend on your age, overall health, lifestyle risk factors, and family history of disease. Counseling  Your health care provider may ask you questions about your: Alcohol use. Tobacco use. Drug  use. Emotional well-being. Home and relationship well-being. Sexual activity. Eating habits. History of falls. Memory and ability to understand (cognition). Work and work Astronomer. Reproductive health. Screening  You may have the following tests or measurements: Height, weight, and BMI. Blood pressure. Lipid and cholesterol levels. These may be checked every 5 years, or more frequently if you are over 35 years old. Skin check. Lung cancer screening. You may have this screening every year starting at age 66 if you have a 30-pack-year history of smoking and currently smoke or have quit within the past 15 years. Fecal occult blood test (FOBT) of the stool. You may have this test every year starting at age 25. Flexible sigmoidoscopy or colonoscopy. You may have a sigmoidoscopy every 5 years or a colonoscopy every 10 years starting at age 81. Hepatitis C blood test. Hepatitis B blood test. Sexually transmitted disease (STD) testing. Diabetes screening. This is done by checking your blood sugar (glucose) after you have not eaten for a while (fasting). You may have this done every 1-3 years. Bone density scan. This is done to screen for osteoporosis. You may have this done starting at age 54. Mammogram. This may be done every 1-2 years. Talk to your health care provider about how often you should have regular mammograms. Talk with your health care provider about your test results, treatment options, and if necessary, the need for more tests. Vaccines  Your health care provider may recommend certain vaccines, such as: Influenza vaccine. This is recommended every year. Tetanus,  diphtheria, and acellular pertussis (Tdap, Td) vaccine. You may need a Td booster every 10 years. Zoster vaccine. You may need this after age 40. Pneumococcal 13-valent conjugate (PCV13) vaccine. One dose is recommended after age 62. Pneumococcal polysaccharide (PPSV23) vaccine. One dose is recommended after age  29. Talk to your health care provider about which screenings and vaccines you need and how often you need them. This information is not intended to replace advice given to you by your health care provider. Make sure you discuss any questions you have with your health care provider. Document Released: 07/15/2015 Document Revised: 03/07/2016 Document Reviewed: 04/19/2015 Elsevier Interactive Patient Education  2017 [employer].  Fall Prevention in the Home Falls can cause injuries. They can happen to people of all ages. There are many things you can do to make your home safe and to help prevent falls. What can I do on the outside of my home? Regularly fix the edges of walkways and driveways and fix any cracks. Remove anything that might make you trip as you walk through a door, such as a raised step or threshold. Trim any bushes or trees on the path to your home. Use bright outdoor lighting. Clear any walking paths of anything that might make someone trip, such as rocks or tools. Regularly check to see if handrails are loose or broken. Make sure that both sides of any steps have handrails. Any raised decks and porches should have guardrails on the edges. Have any leaves, snow, or ice cleared regularly. Use sand or salt on walking paths during winter. Clean up any spills in your garage right away. This includes oil or grease spills. What can I do in the bathroom? Use night lights. Install grab bars by the toilet and in the tub and shower. Do not use towel bars as grab bars. Use non-skid mats or decals in the tub or shower. If you need to sit down in the shower, use a plastic, non-slip stool. Keep the floor dry. Clean up any water that spills on the floor as soon as it happens. Remove soap buildup in the tub or shower regularly. Attach bath mats securely with double-sided non-slip rug tape. Do not have throw rugs and other things on the floor that can make you trip. What can I do in the  bedroom? Use night lights. Make sure that you have a light by your bed that is easy to reach. Do not use any sheets or blankets that are too big for your bed. They should not hang down onto the floor. Have a firm chair that has side arms. You can use this for support while you get dressed. Do not have throw rugs and other things on the floor that can make you trip. What can I do in the kitchen? Clean up any spills right away. Avoid walking on wet floors. Keep items that you use a lot in easy-to-reach places. If you need to reach something above you, use a strong step stool that has a grab bar. Keep electrical cords out of the way. Do not use floor polish or wax that makes floors slippery. If you must use wax, use non-skid floor wax. Do not have throw rugs and other things on the floor that can make you trip. What can I do with my stairs? Do not leave any items on the stairs. Make sure that there are handrails on both sides of the stairs and use them. Fix handrails that are broken or loose. Make  sure that handrails are as long as the stairways. Check any carpeting to make sure that it is firmly attached to the stairs. Fix any carpet that is loose or worn. Avoid having throw rugs at the top or bottom of the stairs. If you do have throw rugs, attach them to the floor with carpet tape. Make sure that you have a light switch at the top of the stairs and the bottom of the stairs. If you do not have them, ask someone to add them for you. What else can I do to help prevent falls? Wear shoes that: Do not have high heels. Have rubber bottoms. Are comfortable and fit you well. Are closed at the toe. Do not wear sandals. If you use a stepladder: Make sure that it is fully opened. Do not climb a closed stepladder. Make sure that both sides of the stepladder are locked into place. Ask someone to hold it for you, if possible. Clearly mark and make sure that you can see: Any grab bars or  handrails. First and last steps. Where the edge of each step is. Use tools that help you move around (mobility aids) if they are needed. These include: Canes. Walkers. Scooters. Crutches. Turn on the lights when you go into a dark area. Replace any light bulbs as soon as they burn out. Set up your furniture so you have a clear path. Avoid moving your furniture around. If any of your floors are uneven, fix them. If there are any pets around you, be aware of where they are. Review your medicines with your doctor. Some medicines can make you feel dizzy. This can increase your chance of falling. Ask your doctor what other things that you can do to help prevent falls. This information is not intended to replace advice given to you by your health care provider. Make sure you discuss any questions you have with your health care provider. Document Released: 04/14/2009 Document Revised: 11/24/2015 Document Reviewed: 07/23/2014 Elsevier Interactive Patient Education  2017 [employer].

## 2023-10-15 ENCOUNTER — Other Ambulatory Visit: Payer: Self-pay

## 2023-10-15 DIAGNOSIS — Z1231 Encounter for screening mammogram for malignant neoplasm of breast: Secondary | ICD-10-CM
